# Patient Record
Sex: Female | Born: 1960
Health system: Southern US, Community
[De-identification: ages and names within clinical notes are randomized; demographics above are authoritative.]

## PROBLEM LIST (undated history)

## (undated) DIAGNOSIS — S0300XA Dislocation of jaw, unspecified side, initial encounter: Secondary | ICD-10-CM

## (undated) DIAGNOSIS — M8580 Other specified disorders of bone density and structure, unspecified site: Secondary | ICD-10-CM

## (undated) DIAGNOSIS — N96 Recurrent pregnancy loss: Secondary | ICD-10-CM

## (undated) HISTORY — PX: DILATION AND CURETTAGE OF UTERUS: SHX78

## (undated) HISTORY — PX: TONSILLECTOMY: SUR1361

## (undated) HISTORY — DX: Recurrent pregnancy loss: N96

## (undated) HISTORY — DX: Dislocation of jaw, unspecified side, initial encounter: S03.00XA

## (undated) HISTORY — DX: Other specified disorders of bone density and structure, unspecified site: M85.80

## (undated) HISTORY — PX: TMJ ARTHROPLASTY: SHX1066

---

## 2001-06-05 HISTORY — PX: TUBAL LIGATION: SHX77

## 2004-05-20 ENCOUNTER — Ambulatory Visit: Payer: Self-pay | Admitting: Family Medicine

## 2005-06-05 HISTORY — PX: VAGINAL HYSTERECTOMY: SUR661

## 2006-02-15 ENCOUNTER — Ambulatory Visit: Payer: Self-pay

## 2006-04-24 ENCOUNTER — Inpatient Hospital Stay: Payer: Self-pay

## 2007-08-14 ENCOUNTER — Ambulatory Visit: Payer: Self-pay | Admitting: Family Medicine

## 2013-10-06 ENCOUNTER — Emergency Department: Payer: Self-pay | Admitting: Emergency Medicine

## 2013-10-06 LAB — COMPREHENSIVE METABOLIC PANEL
AST: 14 U/L — AB (ref 15–37)
Albumin: 4.2 g/dL (ref 3.4–5.0)
Alkaline Phosphatase: 56 U/L
Anion Gap: 7 (ref 7–16)
BILIRUBIN TOTAL: 0.4 mg/dL (ref 0.2–1.0)
BUN: 13 mg/dL (ref 7–18)
CHLORIDE: 106 mmol/L (ref 98–107)
CO2: 26 mmol/L (ref 21–32)
CREATININE: 0.62 mg/dL (ref 0.60–1.30)
Calcium, Total: 9.5 mg/dL (ref 8.5–10.1)
Glucose: 88 mg/dL (ref 65–99)
Osmolality: 277 (ref 275–301)
POTASSIUM: 3.5 mmol/L (ref 3.5–5.1)
SGPT (ALT): 21 U/L (ref 12–78)
Sodium: 139 mmol/L (ref 136–145)
Total Protein: 7.8 g/dL (ref 6.4–8.2)

## 2013-10-06 LAB — CBC WITH DIFFERENTIAL/PLATELET
Basophil #: 0 10*3/uL (ref 0.0–0.1)
Basophil %: 0.6 %
EOS ABS: 0.1 10*3/uL (ref 0.0–0.7)
EOS PCT: 1.5 %
HCT: 39 % (ref 35.0–47.0)
HGB: 12.8 g/dL (ref 12.0–16.0)
LYMPHS ABS: 1.9 10*3/uL (ref 1.0–3.6)
Lymphocyte %: 26.7 %
MCH: 31.2 pg (ref 26.0–34.0)
MCHC: 32.9 g/dL (ref 32.0–36.0)
MCV: 95 fL (ref 80–100)
MONOS PCT: 7.7 %
Monocyte #: 0.6 x10 3/mm (ref 0.2–0.9)
Neutrophil #: 4.5 10*3/uL (ref 1.4–6.5)
Neutrophil %: 63.5 %
PLATELETS: 209 10*3/uL (ref 150–440)
RBC: 4.1 10*6/uL (ref 3.80–5.20)
RDW: 12.6 % (ref 11.5–14.5)
WBC: 7.1 10*3/uL (ref 3.6–11.0)

## 2013-10-06 LAB — URINALYSIS, COMPLETE
BILIRUBIN, UR: NEGATIVE
BLOOD: NEGATIVE
Bacteria: NONE SEEN
Glucose,UR: NEGATIVE mg/dL (ref 0–75)
Ketone: NEGATIVE
LEUKOCYTE ESTERASE: NEGATIVE
Nitrite: NEGATIVE
PROTEIN: NEGATIVE
Ph: 6 (ref 4.5–8.0)
Specific Gravity: 1.002 (ref 1.003–1.030)
WBC UR: 1 /HPF (ref 0–5)

## 2013-10-06 LAB — LIPASE, BLOOD: LIPASE: 185 U/L (ref 73–393)

## 2013-10-06 LAB — TROPONIN I

## 2013-10-10 ENCOUNTER — Ambulatory Visit: Payer: Self-pay | Admitting: Family Medicine

## 2013-11-03 HISTORY — PX: COLONOSCOPY WITH ESOPHAGOGASTRODUODENOSCOPY (EGD): SHX5779

## 2013-11-05 ENCOUNTER — Ambulatory Visit: Payer: Self-pay | Admitting: Gastroenterology

## 2014-07-08 IMAGING — US ABDOMEN ULTRASOUND LIMITED
1 series · 14 of 25 positions shown · non-contrast
Comparison: CT abdomen pelvis -10/10/2013

CLINICAL DATA: Epigastric abdominal pain and nausea

EXAM:
US ABDOMEN LIMITED - RIGHT UPPER QUADRANT

[Series 1: abdomen ultrasound limited · 0.31mm/px · 14 of 44 slices shown]
[im 1/44]
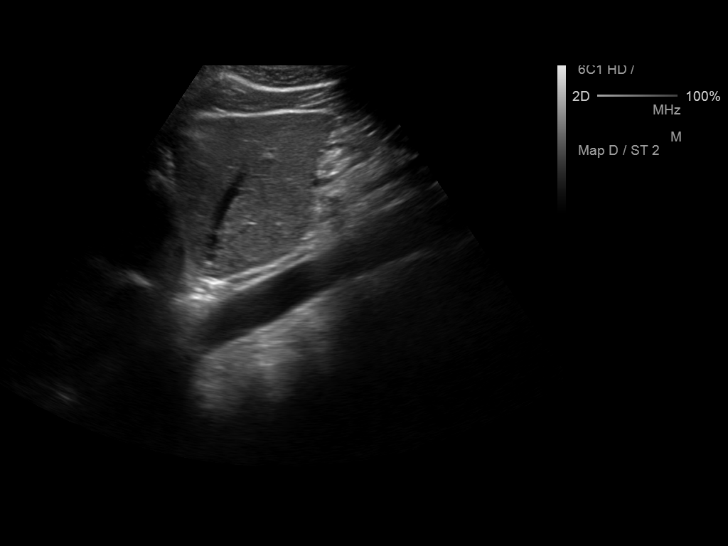
[im 4/44]
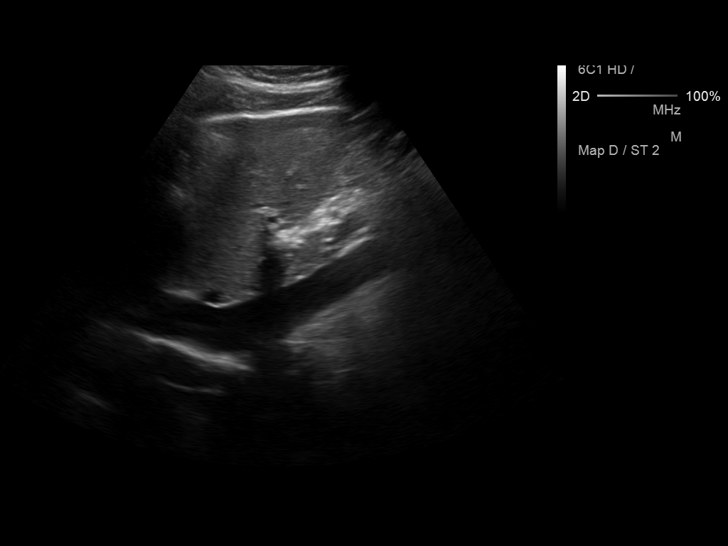
[im 8/44]
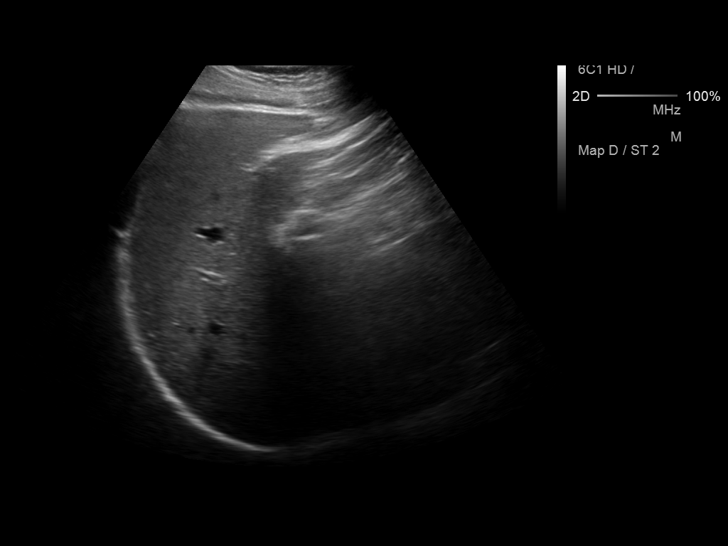
[im 11/44]
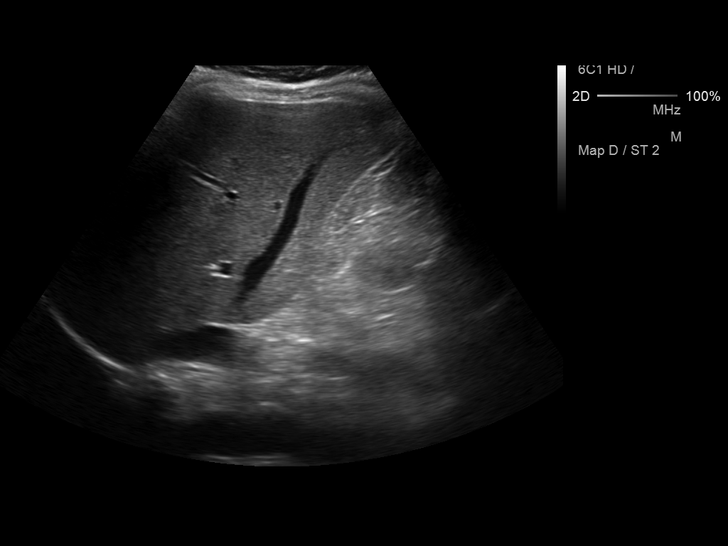
[im 15/44]
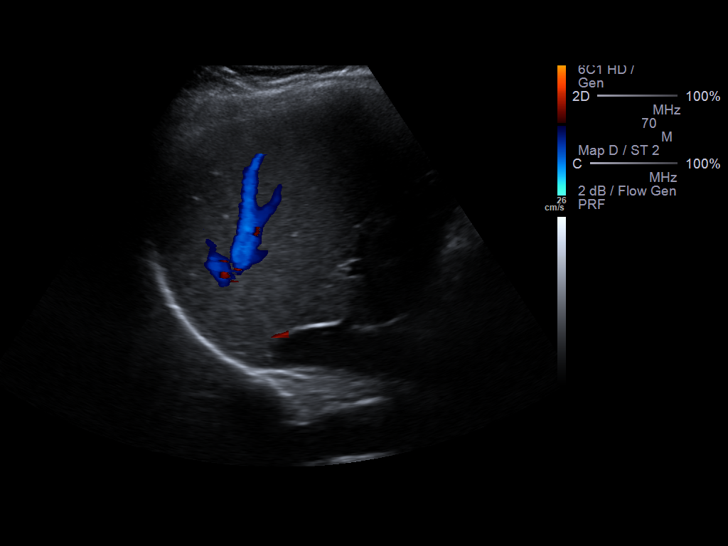
[im 17/44]
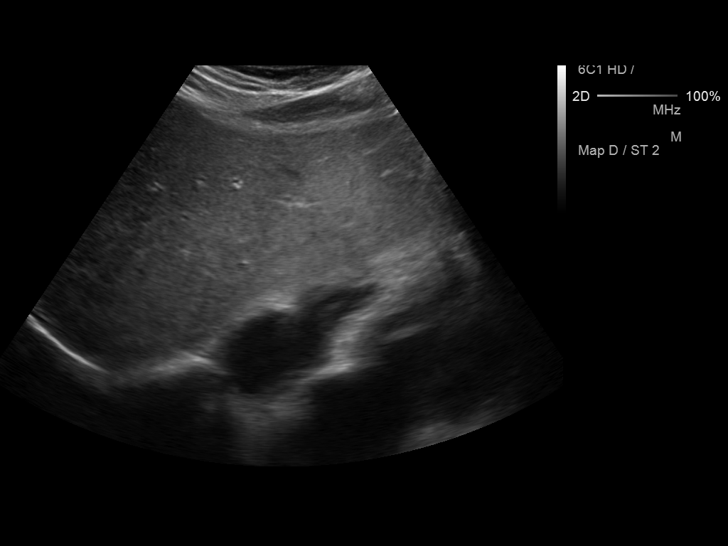
[im 20/44]
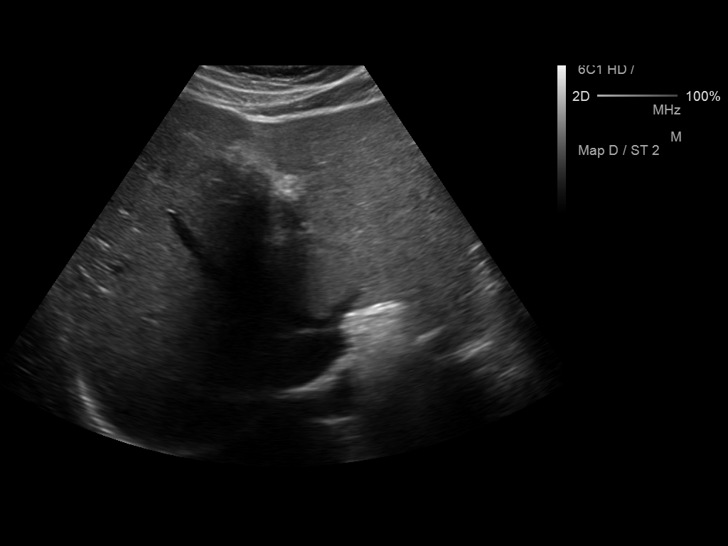
[im 24/44]
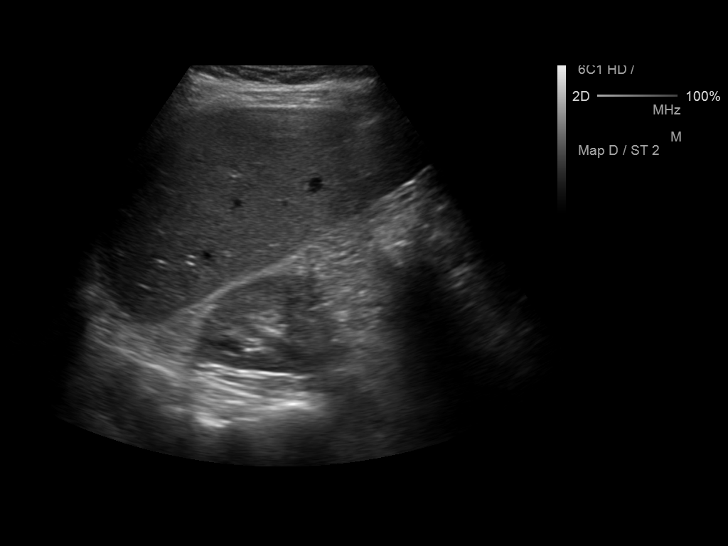
[im 27/44]
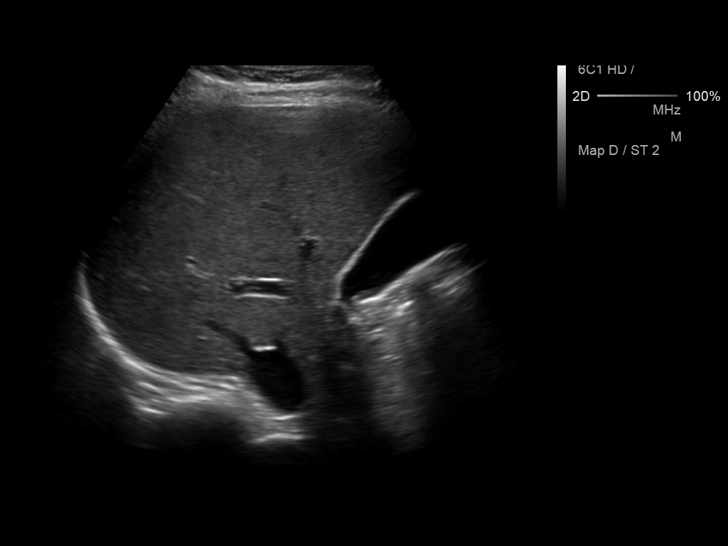
[im 29/44]
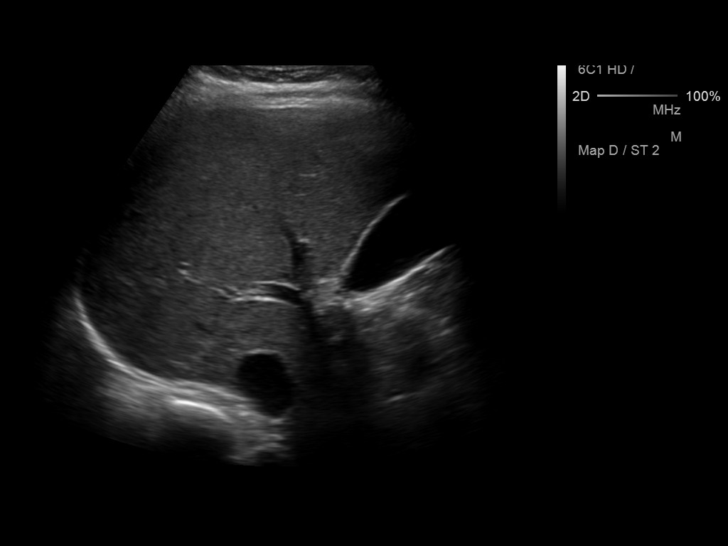
[im 33/44]
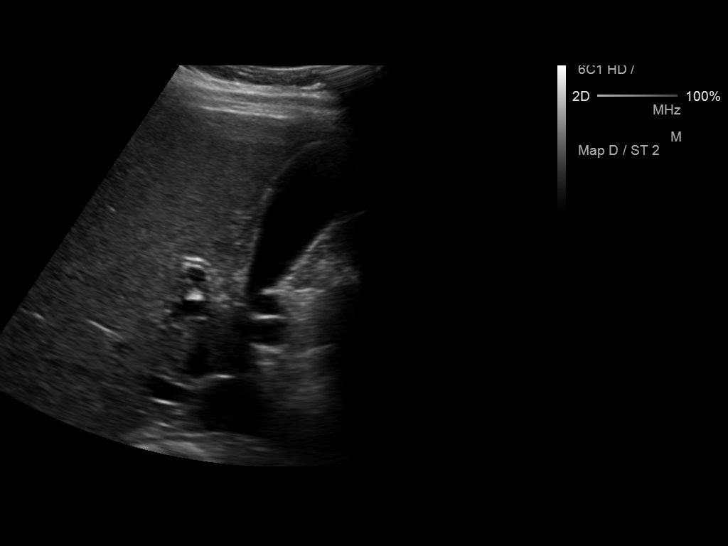
[im 36/44]
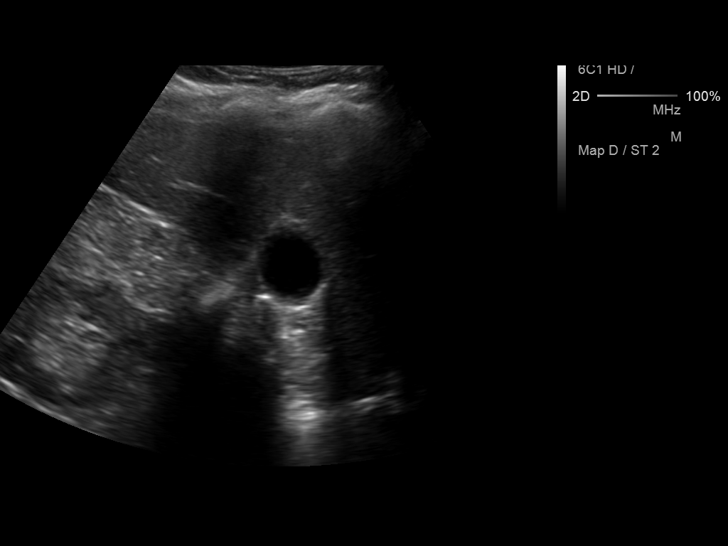
[im 40/44]
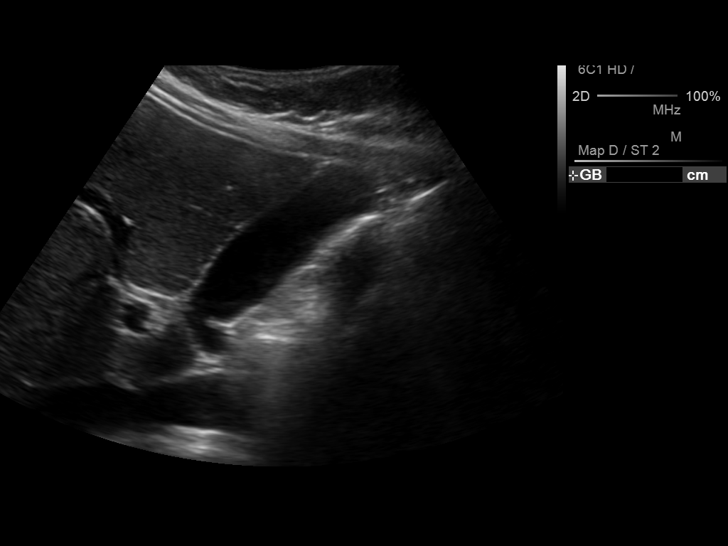
[im 44/44]
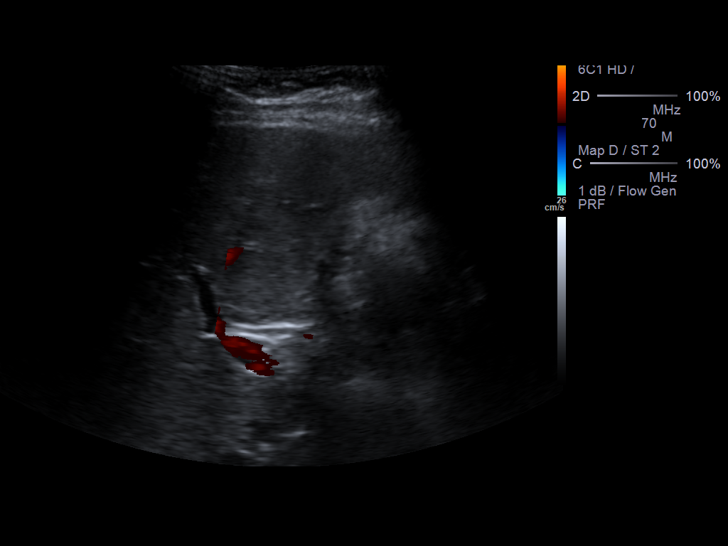

[14 of 25 positions shown; findings below may reference images not displayed]

FINDINGS: Gallbladder:

Sonographically normal. No echogenic gallstones or gall sludge. No
gallbladder wall thickening or pericholecystic fluid. Negative
sonographic Murphy's sign.

Common bile duct:

Diameter: Normal in size measuring 2.1 mm in diameter

Liver:

There is homogeneous echotexture of the hepatic parenchyma. There is
an approximately 1.3 x 0.9 x 1.0 cm anechoic cyst within the
subcapsular aspect of the right lobe of the liver which contains a
thin internal septation and compatible with the previously
characterized hepatic cyst demonstrated on preceding abdominal CT.
There is a minimal amount of focal fatty infiltration adjacent to
the fissure for ligamentum teres (image 23). No intrahepatic biliary
duct dilatation. No ascites.
IMPRESSION: 1. No explanation for patient's epigastric abdominal pain and
nausea. Specifically, no evidence of cholelithiasis.
2. Grossly unchanged minimally complex approximately 1.3 cm hepatic
cyst.

## 2016-01-19 ENCOUNTER — Encounter: Payer: Self-pay | Admitting: *Deleted

## 2016-01-19 ENCOUNTER — Emergency Department
Admission: EM | Admit: 2016-01-19 | Discharge: 2016-01-19 | Disposition: A | Discharge status: Home / Self Care | Payer: Managed Care, Other (non HMO) | Attending: Emergency Medicine | Admitting: Emergency Medicine

## 2016-01-19 DIAGNOSIS — K2970 Gastritis, unspecified, without bleeding: Secondary | ICD-10-CM | POA: Diagnosis not present

## 2016-01-19 DIAGNOSIS — R1013 Epigastric pain: Secondary | ICD-10-CM | POA: Diagnosis present

## 2016-01-19 LAB — CBC
HCT: 40.5 % (ref 35.0–47.0)
HEMOGLOBIN: 13.9 g/dL (ref 12.0–16.0)
MCH: 32 pg (ref 26.0–34.0)
MCHC: 34.3 g/dL (ref 32.0–36.0)
MCV: 93 fL (ref 80.0–100.0)
Platelets: 218 10*3/uL (ref 150–440)
RBC: 4.36 MIL/uL (ref 3.80–5.20)
RDW: 12.1 % (ref 11.5–14.5)
WBC: 7.8 10*3/uL (ref 3.6–11.0)

## 2016-01-19 LAB — URINALYSIS COMPLETE WITH MICROSCOPIC (ARMC ONLY)
Bacteria, UA: NONE SEEN
Bilirubin Urine: NEGATIVE
Glucose, UA: NEGATIVE mg/dL
KETONES UR: NEGATIVE mg/dL
Leukocytes, UA: NEGATIVE
Nitrite: NEGATIVE
PH: 6 (ref 5.0–8.0)
PROTEIN: NEGATIVE mg/dL
SPECIFIC GRAVITY, URINE: 1.006 (ref 1.005–1.030)
WBC UA: NONE SEEN WBC/hpf (ref 0–5)

## 2016-01-19 LAB — COMPREHENSIVE METABOLIC PANEL
ALBUMIN: 4.7 g/dL (ref 3.5–5.0)
ALT: 19 U/L (ref 14–54)
ANION GAP: 10 (ref 5–15)
AST: 19 U/L (ref 15–41)
Alkaline Phosphatase: 57 U/L (ref 38–126)
BUN: 13 mg/dL (ref 6–20)
CHLORIDE: 106 mmol/L (ref 101–111)
CO2: 22 mmol/L (ref 22–32)
CREATININE: 0.52 mg/dL (ref 0.44–1.00)
Calcium: 9.6 mg/dL (ref 8.9–10.3)
GFR calc non Af Amer: >60 mL/min (ref >60)
GLUCOSE: 89 mg/dL (ref 65–99)
Potassium: 3.7 mmol/L (ref 3.5–5.1)
SODIUM: 138 mmol/L (ref 135–145)
Total Bilirubin: 0.7 mg/dL (ref 0.3–1.2)
Total Protein: 8.1 g/dL (ref 6.5–8.1)

## 2016-01-19 LAB — LIPASE, BLOOD: LIPASE: 23 U/L (ref 11–51)

## 2016-01-19 MED ORDER — GI COCKTAIL ~~LOC~~
30.0000 mL | ORAL | Status: AC
  Administered 2016-01-19: 30 mL via ORAL
  Filled 2016-01-19: qty 30

## 2016-01-19 MED ORDER — ONDANSETRON 8 MG PO TBDP
8.0000 mg | ORAL_TABLET | Freq: Once | ORAL | Status: AC
  Administered 2016-01-19: 8 mg via ORAL
  Filled 2016-01-19: qty 1

## 2016-01-19 MED ORDER — METOCLOPRAMIDE HCL 10 MG PO TABS
10.0000 mg | ORAL_TABLET | Freq: Once | ORAL | Status: AC
  Administered 2016-01-19: 10 mg via ORAL
  Filled 2016-01-19: qty 1

## 2016-01-19 MED ORDER — METOCLOPRAMIDE HCL 10 MG PO TABS: 10.0000 mg | ORAL_TABLET | Freq: Four times a day (QID) | ORAL | 0 refills | Status: DC | PRN

## 2016-01-19 MED ORDER — FAMOTIDINE 20 MG PO TABS
40.0000 mg | ORAL_TABLET | Freq: Once | ORAL | Status: AC
  Administered 2016-01-19: 40 mg via ORAL
  Filled 2016-01-19: qty 2

## 2016-01-19 MED ORDER — SUCRALFATE 1 G PO TABS: 1.0000 g | ORAL_TABLET | Freq: Four times a day (QID) | ORAL | 1 refills | Status: DC

## 2016-01-19 MED ORDER — FAMOTIDINE 20 MG PO TABS: 20.0000 mg | ORAL_TABLET | Freq: Two times a day (BID) | ORAL | 0 refills | Status: DC

## 2016-01-19 NOTE — ED Triage Notes (Addendum)
Pt arrived to ED reporting sharp centralized abd pain for the past week. Pt reports having similar pains 2 years ago and no dx was given. Pt reports having had nausea but denies vomiting. Pt reports food does not make the pain worse and nothing this week has decreased pts pain. Pt denies decrease in appetite. No changes in urine or BMs. Pt denies having fevers.

## 2016-01-19 NOTE — ED Provider Notes (Signed)
[facility] Emergency Department Provider Note  ____________________________________________  Time seen: Approximately 5:10 PM  I have reviewed the triage vital signs and the nursing notes.   HISTORY  Chief Complaint Abdominal Pain    HPI Kari Gray is a 55 y.o. female who complains of upper abdominal pain with nausea but no vomiting or diarrhea this and going on intermittently for the past week. She had similar symptoms about 2 years ago and underwent an extensive workup with ultrasound and HIDA scan and CT scan and upper endoscopy without any findings. She tried taking 1 Prevacid this morning which did not relieve the symptoms. It is moderate intensity, waxing and waning, no aggravating or alleviating factors. Unable to establish any pattern to the pain whatsoever. Feels like a ringing twisting sensation in her upper abdomen. Radiates to the back. No radiation to the chest. No chest pain shortness of breath fevers chills. No dizziness or syncope.   History reviewed. No pertinent past medical history.   There are no active problems to display for this patient.    Past Surgical History:  Procedure Laterality Date  . ABDOMINAL HYSTERECTOMY     partial  . TMJ ARTHROPLASTY    . TONSILLECTOMY       Prior to Admission medications   Medication Sig Start Date End Date Taking? Authorizing Provider  famotidine (PEPCID) 20 MG tablet Take 1 tablet (20 mg total) by mouth 2 (two) times daily. 01/19/16   Sharman CheekPhillip Graviel Payeur, MD  metoCLOPramide (REGLAN) 10 MG tablet Take 1 tablet (10 mg total) by mouth every 6 (six) hours as needed. 01/19/16   Sharman CheekPhillip Levie Owensby, MD  sucralfate (CARAFATE) 1 g tablet Take 1 tablet (1 g total) by mouth 4 (four) times daily. 01/19/16   Sharman CheekPhillip Majesty Oehlert, MD     Allergies Codeine   History reviewed. No pertinent family history.  Social History Social History  Substance Use Topics  . Smoking status: Never Smoker  . Smokeless  tobacco: Never Used  . Alcohol use Yes     Comment: socially    Review of Systems  Constitutional:   No fever or chills.  ENT:   No sore throat. No rhinorrhea. Cardiovascular:   No chest pain. Respiratory:   No dyspnea or cough. Gastrointestinal:   Positive upper abdominal pain without vomiting or diarrhea.  Genitourinary:   Negative for dysuria or difficulty urinating. Musculoskeletal:   Negative for focal pain or swelling Neurological:   Negative for headaches 10-point ROS otherwise negative.  ____________________________________________   PHYSICAL EXAM:  VITAL SIGNS: ED Triage Vitals  Enc Vitals Group     BP 01/19/16 1453 (!) 149/85     Pulse Rate 01/19/16 1453 65     Resp 01/19/16 1453 16     Temp 01/19/16 1453 98.6 F (37 C)     Temp Source 01/19/16 1453 Oral     SpO2 01/19/16 1453 100 %     Weight 01/19/16 1450 135 lb (61.2 kg)     Height 01/19/16 1450 5\' 4"  (1.626 m)     Head Circumference --      Peak Flow --      Pain Score 01/19/16 1450 9     Pain Loc --      Pain Edu? --      Excl. in GC? --     Vital signs reviewed, nursing assessments reviewed.   Constitutional:   Alert and oriented. Well appearing and in no distress. Eyes:   No scleral  icterus. No conjunctival pallor. PERRL. EOMI.  No nystagmus. ENT   Head:   Normocephalic and atraumatic.   Nose:   No congestion/rhinnorhea. No septal hematoma   Mouth/Throat:   MMM, mild pharyngeal erythema. No peritonsillar mass.    Neck:   No stridor. No SubQ emphysema. No meningismus. Hematological/Lymphatic/Immunilogical:   No cervical lymphadenopathy. Cardiovascular:   RRR. Symmetric bilateral radial and DP pulses.  No murmurs.  Respiratory:   Normal respiratory effort without tachypnea nor retractions. Breath sounds are clear and equal bilaterally. No wheezes/rales/rhonchi. Gastrointestinal:   Soft with left upper quadrant tenderness. No splenomegaly.. Non distended. There is no CVA tenderness.  No  rebound, rigidity, or guarding. Genitourinary:   deferred Musculoskeletal:   Nontender with normal range of motion in all extremities. No joint effusions.  No lower extremity tenderness.  No edema. Neurologic:   Normal speech and language.  CN 2-10 normal. Motor grossly intact. No gross focal neurologic deficits are appreciated.  Skin:    Skin is warm, dry and intact. No rash noted.  No petechiae, purpura, or bullae.  ____________________________________________    LABS (pertinent positives/negatives) (all labs ordered are listed, but only abnormal results are displayed) Labs Reviewed  URINALYSIS COMPLETEWITH MICROSCOPIC ([facility] ONLY) - Abnormal; Notable for the following:       Result Value   Color, Urine STRAW (*)    APPearance CLEAR (*)    Hgb urine dipstick 1+ (*)    Squamous Epithelial / LPF 0-5 (*)    All other components within normal limits  LIPASE, BLOOD  COMPREHENSIVE METABOLIC PANEL  CBC   ____________________________________________   EKG    ____________________________________________    RADIOLOGY    ____________________________________________   PROCEDURES Procedures  ____________________________________________   INITIAL IMPRESSION / ASSESSMENT AND PLAN / ED COURSE  Pertinent labs & imaging results that were available during my care of the patient were reviewed by me and considered in my medical decision making (see chart for details).  Patient well appearing no acute distress. Afebrile, normal vital signs. Unremarkable labs. Overall exam is benign and reassuring, consistent with gastritis and acid reflux. We'll give a GI cocktail famotidine and Zofran and Reglan and reassess. Follow up with primary care.    ----------------------------------------- 6:50 PM on 01/19/2016 -----------------------------------------  Remains stable. Symptoms not significantly improved with GI cocktail, but otherwise her history and physical are  reassuring.Considering the patient's symptoms, medical history, and physical examination today, I have low suspicion for ACS, PE, TAD, pneumothorax, carditis, mediastinitis, pneumonia, CHF, or sepsis. I have low suspicion for cholecystitis or biliary pathology, pancreatitis, perforation or bowel obstruction, hernia, intra-abdominal abscess, AAA or dissection, volvulus or intussusception, mesenteric ischemia, or appendicitis.     Clinical Course   ____________________________________________   FINAL CLINICAL IMPRESSION(S) / ED DIAGNOSES  Final diagnoses:  Gastritis  Epigastric pain       Portions of this note were generated with dragon dictation software. Dictation errors may occur despite best attempts at proofreading.    Sharman CheekPhillip Tyrian Peart, MD 01/19/16 [phone]

## 2017-02-09 ENCOUNTER — Ambulatory Visit (INDEPENDENT_AMBULATORY_CARE_PROVIDER_SITE_OTHER): Payer: 59 | Admitting: Certified Nurse Midwife

## 2017-02-09 ENCOUNTER — Encounter: Payer: Self-pay | Admitting: Certified Nurse Midwife

## 2017-02-09 VITALS — BP 120/70 | HR 70 | Ht 64.5 in | Wt 149.0 lb

## 2017-02-09 DIAGNOSIS — Z01419 Encounter for gynecological examination (general) (routine) without abnormal findings: Secondary | ICD-10-CM | POA: Diagnosis not present

## 2017-02-09 DIAGNOSIS — N941 Unspecified dyspareunia: Secondary | ICD-10-CM | POA: Diagnosis not present

## 2017-02-09 DIAGNOSIS — N952 Postmenopausal atrophic vaginitis: Secondary | ICD-10-CM

## 2017-02-09 NOTE — Progress Notes (Signed)
Gynecology Annual Exam  PCP: Patient, No Pcp Per  Chief Complaint:  Chief Complaint  Patient presents with  . Gynecologic Exam    History of Present Illness: Kari Gray is a 56 y.o. G3 P0030 WF who presents for her annual gyn exam. The patient complains of vaginal dryness and dyspareunia. Has tried lubricants and Replens with no relief.. She has a history of having a TVH in 2007 for pelvic pain and menorrhagia. She is postmenopausal. She denies hot flashes and vaginal bleeding. Last pap smear: 03/25/2015, results were NIL/neg. She has not history of abnormal Pap smea  Since her last visit, she has had no significant changes in her health.  Her past medical history is remarkable for TMJ and recurrent SABs.  The patient does perform self breast exams. Her last mammogram was 03/25/2015, results were negative.   There is a family history of breast cancer in her MGM. Genetic testing has not been done.  There is no family history of ovarian cancer.   Last colonoscopy: 11/2013. Results: normal. Next one due in 10 years.  Dexa scan: 04/10/2005. Results: osteopenia  The patient denies smoking.  She has an occasional glass of wine.  She denies illegal drug use.  The patient reports exercising occasionally. She goes to a boxing class  The patient denies current symptoms of depression.    Review of Systems: Review of Systems  Constitutional: Negative for chills, fever and weight loss.  HENT: Negative for congestion, sinus pain and sore throat.   Eyes: Negative for blurred vision and pain.  Respiratory: Negative for hemoptysis, shortness of breath and wheezing.   Cardiovascular: Negative for chest pain, palpitations and leg swelling.  Gastrointestinal: Negative for abdominal pain, blood in stool, diarrhea, heartburn, nausea and vomiting.  Genitourinary: Negative for dysuria, frequency, hematuria and urgency.       Positive for vaginal dryness and dyspareunia    Musculoskeletal: Negative for back pain, joint pain and myalgias.  Skin: Negative for itching and rash.  Neurological: Negative for dizziness, tingling and headaches.  Endo/Heme/Allergies: Negative for environmental allergies and polydipsia. Does not bruise/bleed easily.       Negative for hirsutism   Psychiatric/Behavioral: Negative for depression. The patient is not nervous/anxious and does not have insomnia.     Past Medical History:  Past Medical History:  Diagnosis Date  . History of recurrent miscarriages   . Osteopenia   . TMJ (dislocation of temporomandibular joint)     Past Surgical History:  Past Surgical History:  Procedure Laterality Date  . COLONOSCOPY WITH ESOPHAGOGASTRODUODENOSCOPY (EGD)  11/2013  . DILATION AND CURETTAGE OF UTERUS  1999/ 2000/ 2007  . TMJ ARTHROPLASTY    . TONSILLECTOMY    . TUBAL LIGATION  2003   and diagnostic laparoscopy/ Dr Luella Cook  . VAGINAL HYSTERECTOMY  2007   AUB and pelvic pain    Family History:  Family History  Problem Relation Age of Onset  . Rheum arthritis Mother   . Lung cancer Mother 36  . Dementia Mother   . Stroke Father   . Breast cancer Maternal Grandmother 50  . Diabetes Cousin     Social History:  Social History   Social History  . Marital status: Married    Spouse name: N/A  . Number of children: 0  . Years of education: N/A   Occupational History  . Business office    Social History Main Topics  . Smoking status: Never Smoker  . Smokeless  tobacco: Never Used  . Alcohol use Yes     Comment: socially  . Drug use: No  . Sexual activity: Yes    Partners: Male    Birth control/ protection: Post-menopausal   Other Topics Concern  . Not on file   Social History Narrative  . No narrative on file    Allergies:  Allergies  Allergen Reactions  . Codeine Other (See Comments)    Severe headaches.     Medications:  Current Outpatient Prescriptions:  .  aspirin EC 81 MG tablet, Take by mouth.,  Disp: , Rfl:  .  Multiple Vitamin (MULTIVITAMIN) capsule, Take by mouth., Disp: , Rfl:  Physical Exam Vitals: BP 120/70   Pulse 70   Ht 5' 4.5" (1.638 m)   Wt 149 lb (67.6 kg)   BMI 25.18 kg/m   General: WF in NAD HEENT: normocephalic, anicteric Neck: no thyroid enlargement, no palpable nodules, no cervical lymphadenopathy  Pulmonary: No increased work of breathing, CTAB Cardiovascular: RRR, without murmur  Breast: Breast symmetrical, no tenderness, no palpable nodules or masses, no skin or nipple retraction present, no nipple discharge.  No axillary, infraclavicular or supraclavicular lymphadenopathy. Abdomen: Soft, non-tender, non-distended.  Umbilicus without lesions.  No hepatomegaly or masses palpable. No evidence of hernia. Genitourinary:  External: Normal external female genitalia.  Normal urethral meatus, normal Bartholin'Gray and Skene'Gray glands.    Vagina: pale flattened rugae, no evidence of prolapse.    Cervix: surgically absent  Uterus: surgically absent  Adnexa: No adnexal masses, non-tender  Rectal: deferred  Lymphatic: no evidence of inguinal lymphadenopathy Extremities: no edema, erythema, or tenderness Neurologic: Grossly intact Psychiatric: mood appropriate, affect full     Assessment: 56 y.o. postmenopausal annual gyn exam Dyspareunia due to vaginal dryness   1) Breast cancer screening - recommend monthly self breast exam and annual screening mammograms.  [facility]  2) Discussed treatments for vaginal dryness due to menopause: lubricants, lubricants, topical estrogen, Intrarosa, Osphena, and oral estrogen. Discussed pros and cons and risks of each of these. Mutual decision made to try Estrace vaginal cream: 1Gram 3x/week. Can decrease to 2x/Week depending on response.   3) Pap not done. ASCCP guidelines and rational discussed.  Patient opts for every 3 years screening interval  4) Colon cancer screening: colonoscopy UTD  5) Routine  healthcare maintenance including cholesterol and diabetes screening not discussed.   6) Osteoporosis prevention: Discussed calcium and vitamin D3 requirements as well as the role of exercise in osteoporosis prevention. Consider Dexa scan next year.  Kari Connersolleen Willet Schleifer, CNM

## 2017-02-10 ENCOUNTER — Encounter: Payer: Self-pay | Admitting: Certified Nurse Midwife

## 2017-02-10 DIAGNOSIS — M8580 Other specified disorders of bone density and structure, unspecified site: Secondary | ICD-10-CM | POA: Insufficient documentation

## 2017-02-10 DIAGNOSIS — N952 Postmenopausal atrophic vaginitis: Secondary | ICD-10-CM | POA: Insufficient documentation

## 2017-02-10 MED ORDER — ESTRADIOL 0.1 MG/GM VA CREA: 1.0000 | TOPICAL_CREAM | VAGINAL | 2 refills | Status: DC

## 2017-09-07 ENCOUNTER — Encounter: Payer: Self-pay | Admitting: Podiatry

## 2017-09-07 ENCOUNTER — Ambulatory Visit: Payer: 59 | Admitting: Podiatry

## 2017-09-07 ENCOUNTER — Ambulatory Visit (INDEPENDENT_AMBULATORY_CARE_PROVIDER_SITE_OTHER): Payer: 59

## 2017-09-07 DIAGNOSIS — M779 Enthesopathy, unspecified: Secondary | ICD-10-CM | POA: Diagnosis not present

## 2017-09-07 DIAGNOSIS — M2010 Hallux valgus (acquired), unspecified foot: Secondary | ICD-10-CM

## 2017-09-07 DIAGNOSIS — M778 Other enthesopathies, not elsewhere classified: Secondary | ICD-10-CM

## 2017-09-07 MED ORDER — MELOXICAM 15 MG PO TABS: 15.0000 mg | ORAL_TABLET | Freq: Every day | ORAL | 1 refills | Status: AC

## 2017-09-10 NOTE — Progress Notes (Signed)
   Subjective: 57 year old female presenting today as a new patient with a chief complaint of bunions bilaterally, left worse than right, that have been present for over a year. She reports associated pain to the dorsum of the left foot. She has not done anything to treat the symptoms. Walking increases the pain to the dorsum of the foot. Patient is here for further evaluation and treatment.    Past Medical History:  Diagnosis Date  . History of recurrent miscarriages   . Osteopenia   . TMJ (dislocation of temporomandibular joint)       Objective: Physical Exam General: The patient is alert and oriented x3 in no acute distress.  Dermatology: Skin is cool, dry and supple bilateral lower extremities. Negative for open lesions or macerations.  Vascular: Palpable pedal pulses bilaterally. No edema or erythema noted. Capillary refill within normal limits.  Neurological: Epicritic and protective threshold grossly intact bilaterally.   Musculoskeletal Exam: Clinical evidence of bunion deformity noted to the respective foot. Negative for pain on palpation or with range of motion of the first MPJ. Lateral deviation of the hallux noted consistent with hallux abductovalgus.  Radiographic Exam: Increased intermetatarsal angle greater than 15 with a hallux abductus angle greater than 30 noted on AP view. Moderate degenerative changes noted within the first MPJ.  Assessment: 1. HAV w/ bunion deformity bilateral - asymptomatic  2. Midfoot DJD/capsulitis left    Plan of Care:  1. Patient was evaluated. X-Rays reviewed. 2. Today we discussed the conservative versus surgical management of bunion deformity. 3. Recommended conservative treatment for patient. Surgery would likely not improve patient's midfoot symptoms.  4. Recommended Vionic sandals and OTC insoles.  5. Prescription for Meloxicam provided to patient.  6. Return to clinic as needed.    Felecia ShellingBrent M. Latash Nouri, DPM [facility]  Dr. Felecia ShellingBrent M. Nadira Single, DPM    [address]                                        [place], [place] [address]                Office [phone]  Fax [phone]

## 2018-05-17 ENCOUNTER — Encounter: Payer: Self-pay | Admitting: Certified Nurse Midwife

## 2018-05-17 ENCOUNTER — Ambulatory Visit (INDEPENDENT_AMBULATORY_CARE_PROVIDER_SITE_OTHER): Payer: 59 | Admitting: Certified Nurse Midwife

## 2018-05-17 ENCOUNTER — Other Ambulatory Visit (HOSPITAL_COMMUNITY)
Admission: RE | Admit: 2018-05-17 | Discharge: 2018-05-17 | Disposition: A | Discharge status: Home / Self Care | Payer: 59 | Source: Ambulatory Visit | Attending: Certified Nurse Midwife | Admitting: Certified Nurse Midwife

## 2018-05-17 VITALS — BP 110/70 | HR 64 | Ht 64.0 in | Wt 150.0 lb

## 2018-05-17 DIAGNOSIS — Z1159 Encounter for screening for other viral diseases: Secondary | ICD-10-CM

## 2018-05-17 DIAGNOSIS — Z01419 Encounter for gynecological examination (general) (routine) without abnormal findings: Secondary | ICD-10-CM | POA: Diagnosis not present

## 2018-05-17 DIAGNOSIS — Z131 Encounter for screening for diabetes mellitus: Secondary | ICD-10-CM

## 2018-05-17 DIAGNOSIS — Z23 Encounter for immunization: Secondary | ICD-10-CM | POA: Diagnosis not present

## 2018-05-17 DIAGNOSIS — Z1272 Encounter for screening for malignant neoplasm of vagina: Secondary | ICD-10-CM | POA: Diagnosis present

## 2018-05-17 DIAGNOSIS — Z13220 Encounter for screening for lipoid disorders: Secondary | ICD-10-CM

## 2018-05-17 MED ORDER — ESTRADIOL 0.1 MG/GM VA CREA: 1.0000 | TOPICAL_CREAM | VAGINAL | 2 refills | Status: DC

## 2018-05-17 NOTE — Progress Notes (Signed)
Gynecology Annual Exam  PCP: Patient, No Pcp Per  Chief Complaint:  Chief Complaint  Patient presents with  . Gynecologic Exam    History of Present Illness: Kari Gray is a 57 y.o. G3 P0030 WF who presents for her annual gyn exam. The patient has no gyn complaints. Started using Estrace cream vaginally for vaginal dryness and dyspareunia with relief of symptoms. Currently uses the topical estrogen twice a week. She has a history of having a TVH in 2007 for pelvic pain and menorrhagia. She is postmenopausal. She denies significant  hot flashes or vaginal bleeding. Last pap smear: 03/25/2015, results were NIL/neg. She has not history of abnormal Pap smears.  Since her last visit 02/09/2017, she has had no other significant changes in her health. She has a little nasal congestion currently. She did receive her flu vaccine this year.   Her past medical history is remarkable for TMJ and recurrent SABs.  The patient does perform self breast exams. Her last mammogram was 02/27/2017, results were negative.   There is a family history of breast cancer in her MGM. Genetic testing has not been done.  There is no family history of ovarian cancer.   Last colonoscopy: 11/2013. Results: normal. Next one due in 10 years.  Dexa scan: 04/10/2005. Results: osteopenia  The patient denies smoking.  She has 2-3 glasses of wine a week.  She denies illegal drug use.  The patient reports exercising occasionally.   The patient denies current symptoms of depression.    Review of Systems: Review of Systems  Constitutional: Negative for chills, fever and weight loss.  HENT: Negative for congestion, sinus pain and sore throat.   Eyes: Negative for blurred vision and pain.  Respiratory: Negative for hemoptysis, shortness of breath and wheezing.   Cardiovascular: Negative for chest pain, palpitations and leg swelling.  Gastrointestinal: Negative for abdominal pain, blood in stool, diarrhea,  heartburn, nausea and vomiting.  Genitourinary: Negative for dysuria, frequency, hematuria and urgency.       Positive for difficulty holding urine.  Musculoskeletal: Negative for back pain, joint pain and myalgias.  Skin: Negative for itching and rash.  Neurological: Negative for dizziness, tingling and headaches.  Endo/Heme/Allergies: Negative for environmental allergies and polydipsia. Does not bruise/bleed easily.       Negative for hirsutism   Psychiatric/Behavioral: Negative for depression. The patient is not nervous/anxious and does not have insomnia.     Past Medical History:  Past Medical History:  Diagnosis Date  . History of recurrent miscarriages   . Osteopenia   . TMJ (dislocation of temporomandibular joint)     Past Surgical History:  Past Surgical History:  Procedure Laterality Date  . COLONOSCOPY WITH ESOPHAGOGASTRODUODENOSCOPY (EGD)  11/2013  . DILATION AND CURETTAGE OF UTERUS  1999/ 2000/ 2007  . TMJ ARTHROPLASTY    . TONSILLECTOMY    . TUBAL LIGATION  2003   and diagnostic laparoscopy/ Dr Luella Cookosenow  . VAGINAL HYSTERECTOMY  2007   AUB and pelvic pain    Family History:  Family History  Problem Relation Age of Onset  . Rheum arthritis Mother   . Lung cancer Mother [id number]  . Dementia Mother   . Stroke Father   . Breast cancer Maternal Grandmother [id number]  . Diabetes Cousin     Social History:  Social History   Socioeconomic History  . Marital status: Married    Spouse name: Not on file  . Number of children: 0  .  Years of education: Not on file  . Highest education level: Not on file  Occupational History  . Occupation: Geologist, engineering  Social Needs  . Financial resource strain: Not on file  . Food insecurity:    Worry: Not on file    Inability: Not on file  . Transportation needs:    Medical: Not on file    Non-medical: Not on file  Tobacco Use  . Smoking status: Former Smoker    Last attempt to quit: 06/05/1997    Years since quitting: 20.9  .  Smokeless tobacco: Never Used  Substance and Sexual Activity  . Alcohol use: Yes    Comment: socially  . Drug use: No  . Sexual activity: Yes    Partners: Male    Birth control/protection: Post-menopausal  Lifestyle  . Physical activity:    Days per week: Not on file    Minutes per session: Not on file  . Stress: Not on file  Relationships  . Social connections:    Talks on phone: Not on file    Gets together: Not on file    Attends religious service: Not on file    Active member of club or organization: Not on file    Attends meetings of clubs or organizations: Not on file    Relationship status: Not on file  . Intimate partner violence:    Fear of current or ex partner: Not on file    Emotionally abused: Not on file    Physically abused: Not on file    Forced sexual activity: Not on file  Other Topics Concern  . Not on file  Social History Narrative  . Not on file    Allergies:  Allergies  Allergen Reactions  . Codeine Other (See Comments)    Severe headaches.     Medications:  Current Outpatient Medications:  .  aspirin EC 81 MG tablet, Take by mouth., Disp: , Rfl:  .  estradiol (ESTRACE VAGINAL) 0.1 MG/GM vaginal cream, Place 1 Applicatorful vaginally 3 (three) times a week. Can use 2-3 times weekly, Disp: 42.5 g, Rfl: 2 .  Multiple Vitamin (MULTIVITAMIN) capsule, Take by mouth., Disp: , Rfl:  Physical Exam Vitals: BP 110/70   Pulse 64   Ht 5\' 4"  (1.626 m)   Wt 150 lb (68 kg)   BMI 25.75 kg/m   General: WF in NAD HEENT: normocephalic, anicteric Neck: no thyroid enlargement, no palpable nodules, no cervical lymphadenopathy  Pulmonary: No increased work of breathing, CTAB Cardiovascular: RRR, without murmur  Breast: Breast symmetrical, no tenderness, no palpable nodules or masses, no skin or nipple retraction present, no nipple discharge.  No axillary, infraclavicular or supraclavicular lymphadenopathy. Abdomen: Soft, non-tender, non-distended.  Umbilicus  without lesions.  No hepatomegaly or masses palpable. No evidence of hernia. Genitourinary:  External: Normal external female genitalia.  Normal urethral meatus, normal Bartholin's and Skene's glands.    Vagina: pale flattened rugae, no evidence of prolapse.    Cervix: surgically absent  Uterus: surgically absent  Adnexa: No adnexal masses, non-tender  Rectal: deferred  Lymphatic: no evidence of inguinal lymphadenopathy Extremities: no edema, erythema, or tenderness Neurologic: Grossly intact Psychiatric: mood appropriate, affect full     Assessment: 57 y.o. postmenopausal annual gyn exam Dyspareunia / vaginal dryness resolved with topical estrogen   1) Breast cancer screening - recommend monthly self breast exam and annual screening mammograms.  Mammogram scheduled for today.  2) Reviewed treatments for vaginal dryness due to menopause: topical estrogen, Andreas Newport,  Osphena. Refilled Estrace and given samples of Imvexxy.    3) Pap done. ASCCP guidelines and rational discussed.  Patient opts for every 3 years screening interval  4) Colon cancer screening: colonoscopy UTD  5) Routine healthcare maintenance labs including cholesterol and diabetes screening done. TDAP is also needed.  6) Osteoporosis prevention: Discussed calcium and vitamin D3 requirements as well as the role of exercise in osteoporosis prevention. Declines Dexa scan this year.  Farrel Conners, CNM

## 2018-05-18 LAB — HEPATITIS C ANTIBODY

## 2018-05-18 LAB — LIPID PANEL WITH LDL/HDL RATIO
Cholesterol, Total: 213 mg/dL — ABNORMAL HIGH (ref 100–199)
HDL: 64 mg/dL (ref >39)
LDL CALC: 96 mg/dL (ref 0–99)
LDl/HDL Ratio: 1.5 ratio (ref 0.0–3.2)
Triglycerides: 265 mg/dL — ABNORMAL HIGH (ref 0–149)
VLDL CHOLESTEROL CAL: 53 mg/dL — AB (ref 5–40)

## 2018-05-18 LAB — HGB A1C W/O EAG: HEMOGLOBIN A1C: 5.1 % (ref 4.8–5.6)

## 2018-05-21 LAB — CYTOLOGY - PAP: DIAGNOSIS: NEGATIVE

## 2018-06-12 ENCOUNTER — Telehealth: Payer: Self-pay | Admitting: Certified Nurse Midwife

## 2018-06-12 DIAGNOSIS — E781 Pure hyperglyceridemia: Secondary | ICD-10-CM

## 2018-06-12 NOTE — Telephone Encounter (Signed)
Patient called with lab results. Will repeat lipid panel when fasting and will add TSH and BUN/creatinine. Kari Gray, CNM

## 2018-06-25 ENCOUNTER — Other Ambulatory Visit: Payer: 59

## 2018-06-25 DIAGNOSIS — E781 Pure hyperglyceridemia: Secondary | ICD-10-CM

## 2018-06-26 LAB — TSH: TSH: 2.25 u[IU]/mL (ref 0.450–4.500)

## 2018-06-26 LAB — BUN+CREAT
BUN / CREAT RATIO: 25 — AB (ref 9–23)
BUN: 16 mg/dL (ref 6–24)
Creatinine, Ser: 0.63 mg/dL (ref 0.57–1.00)
GFR calc Af Amer: 117 mL/min/{1.73_m2} (ref >59)
GFR calc non Af Amer: 101 mL/min/{1.73_m2} (ref >59)

## 2018-06-26 LAB — LIPID PANEL
CHOLESTEROL TOTAL: 236 mg/dL — AB (ref 100–199)
Chol/HDL Ratio: 3.5 ratio (ref 0.0–4.4)
HDL: 68 mg/dL (ref >39)
LDL CALC: 135 mg/dL — AB (ref 0–99)
TRIGLYCERIDES: 167 mg/dL — AB (ref 0–149)
VLDL CHOLESTEROL CAL: 33 mg/dL (ref 5–40)

## 2018-07-09 ENCOUNTER — Encounter: Payer: Self-pay | Admitting: Certified Nurse Midwife

## 2018-07-11 ENCOUNTER — Other Ambulatory Visit: Payer: Self-pay | Admitting: Certified Nurse Midwife

## 2018-07-11 NOTE — Telephone Encounter (Signed)
90d supply requested

## 2018-07-23 ENCOUNTER — Other Ambulatory Visit: Payer: Self-pay | Admitting: Podiatry

## 2018-12-31 ENCOUNTER — Other Ambulatory Visit: Payer: Self-pay | Admitting: Certified Nurse Midwife

## 2019-04-29 ENCOUNTER — Other Ambulatory Visit: Payer: Self-pay

## 2019-04-29 DIAGNOSIS — Z20822 Contact with and (suspected) exposure to covid-19: Secondary | ICD-10-CM

## 2019-05-01 LAB — NOVEL CORONAVIRUS, NAA: SARS-CoV-2, NAA: NOT DETECTED

## 2019-05-20 ENCOUNTER — Encounter: Payer: Self-pay | Admitting: Certified Nurse Midwife

## 2019-05-20 ENCOUNTER — Other Ambulatory Visit: Payer: Self-pay

## 2019-05-20 ENCOUNTER — Ambulatory Visit (INDEPENDENT_AMBULATORY_CARE_PROVIDER_SITE_OTHER): Payer: BC Managed Care – PPO | Admitting: Certified Nurse Midwife

## 2019-05-20 VITALS — BP 114/70 | HR 63 | Temp 97.1°F | Ht 65.5 in | Wt 155.0 lb

## 2019-05-20 DIAGNOSIS — N952 Postmenopausal atrophic vaginitis: Secondary | ICD-10-CM

## 2019-05-20 DIAGNOSIS — Z01419 Encounter for gynecological examination (general) (routine) without abnormal findings: Secondary | ICD-10-CM

## 2019-05-20 DIAGNOSIS — Z1239 Encounter for other screening for malignant neoplasm of breast: Secondary | ICD-10-CM

## 2019-05-20 MED ORDER — ESTRADIOL 0.1 MG/GM VA CREA: 1.0000 | TOPICAL_CREAM | VAGINAL | 2 refills | Status: DC

## 2019-05-20 NOTE — Progress Notes (Signed)
Gynecology Annual Exam  PCP: Patient, No Pcp Per  Chief Complaint:  Chief Complaint  Patient presents with  . Gynecologic Exam    History of Present Illness: Kari Gray is a 58 y.o. G3 P0030 WF who presents for her annual gyn exam. The patient has no gyn complaints. Has been using Estrace cream vaginally for vaginal dryness and dyspareunia with relief of symptoms. Currently uses the topical estrogen twice a week. She has a history of having a TVH in 2007 for pelvic pain and menorrhagia. She is postmenopausal. She denies significant  hot flashes or vaginal bleeding. Last pap smear: 05/17/2018 , results were NIL.Marland Kitchen No history of abnormal Pap smears.  Since her last visit 05/17/2018, she has had no other significant changes in her health. She reports that her mother, who is in a long term care facility, has just been diagnosed with Covid 19.   Her past medical history is remarkable for TMJ and recurrent SABs.  The patient does perform self breast exams. Her last mammogram was 05/17/2018, results were negative.   There is a family history of breast cancer in her MGM. Genetic testing has not been done.  There is no family history of ovarian cancer.   Last colonoscopy: 11/2013. Results: normal. Next one due in 10 years.  Dexa scan: 04/10/2005. Results: osteopenia  The patient denies smoking.  She has 2-3 glasses of wine a week.  She denies illegal drug use.  The patient reports exercising by walking..   Had a lipid panel done 2019 which was borderline elevated. Recommend repeating when fasting  The patient denies current symptoms of depression.    Review of Systems: Review of Systems  Constitutional: Negative for chills, fever and weight loss.  HENT: Negative for congestion, sinus pain and sore throat.   Eyes: Negative for blurred vision and pain.  Respiratory: Negative for hemoptysis, shortness of breath and wheezing.   Cardiovascular: Negative for chest pain,  palpitations and leg swelling.  Gastrointestinal: Negative for abdominal pain, blood in stool, diarrhea, heartburn, nausea and vomiting.  Genitourinary: Negative for dysuria, frequency, hematuria and urgency.  Musculoskeletal: Negative for back pain, joint pain and myalgias.  Skin: Negative for itching and rash.  Neurological: Negative for dizziness, tingling and headaches.  Endo/Heme/Allergies: Negative for environmental allergies and polydipsia. Does not bruise/bleed easily.       Negative for hirsutism   Psychiatric/Behavioral: Negative for depression. The patient is not nervous/anxious and does not have insomnia.     Past Medical History:  Past Medical History:  Diagnosis Date  . History of recurrent miscarriages   . Osteopenia   . TMJ (dislocation of temporomandibular joint)     Past Surgical History:  Past Surgical History:  Procedure Laterality Date  . COLONOSCOPY WITH ESOPHAGOGASTRODUODENOSCOPY (EGD)  11/2013  . DILATION AND CURETTAGE OF UTERUS  1999/ 2000/ 2007  . TMJ ARTHROPLASTY    . TONSILLECTOMY    . TUBAL LIGATION  2003   and diagnostic laparoscopy/ Dr Luella Cook  . VAGINAL HYSTERECTOMY  2007   AUB and pelvic pain    Family History:  Family History  Problem Relation Age of Onset  . Rheum arthritis Mother   . Lung cancer Mother 9  . Dementia Mother   . Stroke Father   . Breast cancer Maternal Grandmother 86  . Diabetes Cousin     Social History:  Social History   Socioeconomic History  . Marital status: Married    Spouse name:  Not on file  . Number of children: 0  . Years of education: Not on file  . Highest education level: Not on file  Occupational History  . Occupation: Business office  Tobacco Use  . Smoking status: Former Smoker    Quit date: 06/05/1997    Years since quitting: 21.9  . Smokeless tobacco: Never Used  Substance and Sexual Activity  . Alcohol use: Yes    Comment: socially  . Drug use: No  . Sexual activity: Yes    Partners:  Male    Birth control/protection: Post-menopausal  Other Topics Concern  . Not on file  Social History Narrative  . Not on file   Social Determinants of Health   Financial Resource Strain:   . Difficulty of Paying Living Expenses: Not on file  Food Insecurity:   . Worried About Programme researcher, broadcasting/film/videounning Out of Food in the Last Year: Not on file  . Ran Out of Food in the Last Year: Not on file  Transportation Needs:   . Lack of Transportation (Medical): Not on file  . Lack of Transportation (Non-Medical): Not on file  Physical Activity:   . Days of Exercise per Week: Not on file  . Minutes of Exercise per Session: Not on file  Stress:   . Feeling of Stress : Not on file  Social Connections:   . Frequency of Communication with Friends and Family: Not on file  . Frequency of Social Gatherings with Friends and Family: Not on file  . Attends Religious Services: Not on file  . Active Member of Clubs or Organizations: Not on file  . Attends BankerClub or Organization Meetings: Not on file  . Marital Status: Not on file  Intimate Partner Violence:   . Fear of Current or Ex-Partner: Not on file  . Emotionally Abused: Not on file  . Physically Abused: Not on file  . Sexually Abused: Not on file    Allergies:  Allergies  Allergen Reactions  . Codeine Other (See Comments)    Severe headaches.     Medications:  Current Outpatient Medications:  .  aspirin EC 81 MG tablet, Take by mouth., Disp: , Rfl:  .  [START ON 05/22/2019] estradiol (ESTRACE) 0.1 MG/GM vaginal cream, Place 1 Applicatorful vaginally 2 (two) times a week. Can use 2-3 times weekly, Disp: 42.5 g, Rfl: 2 .  Multiple Vitamin (MULTIVITAMIN) capsule, Take by mouth., Disp: , Rfl:  Physical Exam Vitals: BP 114/70   Pulse 63   Temp (!) 97.1 F (36.2 C)   Ht 5' 5.5" (1.664 m)   Wt 155 lb (70.3 kg)   BMI 25.40 kg/m   General: WF in NAD HEENT: normocephalic, anicteric Neck: no thyroid enlargement, no palpable nodules, no cervical  lymphadenopathy  Pulmonary: No increased work of breathing, CTAB Cardiovascular: RRR, without murmur  Breast: Breast symmetrical, no tenderness, no palpable nodules or masses, no skin or nipple retraction present, no nipple discharge.  No axillary, infraclavicular or supraclavicular lymphadenopathy. Abdomen: Soft, non-tender, non-distended.  Umbilicus without lesions.  No hepatomegaly or masses palpable. No evidence of hernia. Genitourinary:  External: Normal external female genitalia.  Normal urethral meatus, normal Bartholin's and Skene's glands.    Vagina: pale flattened rugae, no evidence of prolapse.    Cervix: surgically absent  Uterus: surgically absent  Adnexa: No adnexal masses, non-tender  Rectal: deferred  Lymphatic: no evidence of inguinal lymphadenopathy Extremities: no edema, erythema, or tenderness Neurologic: Grossly intact Psychiatric: mood appropriate, affect full  Assessment: 58 y.o. postmenopausal annual gyn exam Dyspareunia / vaginal dryness resolved with topical estrogen   1) Breast cancer screening - recommend monthly self breast exam and annual screening mammograms. Patient to schedule [facility] at [facility].  2)  Refilled Estrace    3) Pap not done. ASCCP guidelines and rational discussed.  Patient opts for every 3 years screening interval  4) Colon cancer screening: colonoscopy UTD  5) Routine healthcare maintenance labs including cholesterol and diabetes UTD   6) Osteoporosis prevention: Aware of calcium and vitamin D3 requirements as well as the role of exercise in osteoporosis prevention. Declines Dexa scan this year.  Dalia Heading, CNM

## 2019-06-03 DIAGNOSIS — Z1231 Encounter for screening mammogram for malignant neoplasm of breast: Secondary | ICD-10-CM | POA: Diagnosis not present

## 2020-05-24 ENCOUNTER — Other Ambulatory Visit: Payer: Self-pay

## 2020-05-24 ENCOUNTER — Encounter: Payer: Self-pay | Admitting: Obstetrics and Gynecology

## 2020-05-24 ENCOUNTER — Ambulatory Visit (INDEPENDENT_AMBULATORY_CARE_PROVIDER_SITE_OTHER): Payer: BC Managed Care – PPO | Admitting: Obstetrics and Gynecology

## 2020-05-24 VITALS — BP 130/70 | Ht 64.5 in | Wt 156.0 lb

## 2020-05-24 DIAGNOSIS — Z1231 Encounter for screening mammogram for malignant neoplasm of breast: Secondary | ICD-10-CM | POA: Diagnosis not present

## 2020-05-24 DIAGNOSIS — N952 Postmenopausal atrophic vaginitis: Secondary | ICD-10-CM

## 2020-05-24 DIAGNOSIS — Z01419 Encounter for gynecological examination (general) (routine) without abnormal findings: Secondary | ICD-10-CM

## 2020-05-24 DIAGNOSIS — Z13220 Encounter for screening for lipoid disorders: Secondary | ICD-10-CM | POA: Diagnosis not present

## 2020-05-24 DIAGNOSIS — Z0000 Encounter for general adult medical examination without abnormal findings: Secondary | ICD-10-CM | POA: Diagnosis not present

## 2020-05-24 NOTE — Progress Notes (Signed)
PCP: Patient, No Pcp Per   Chief Complaint  Patient presents with   Gynecologic Exam    No concerns    HPI:      Kari Gray is a 59 y.o. G3P0030 whose LMP was No LMP recorded. Patient has had a hysterectomy., presents today for her annual examination.  Her menses are absent due to [facility] I 2007 for pelvic pain and menorrhagia. No PMB, no vasomotor sx.   Sex activity: infrequently active with single partner, contraception - status post hysterectomy. She does have vaginal dryness, improved with estrace. Not using currently due to Rx ran out but ok without it.  Last Pap: 05/17/18  Results were: no abnormalities ; no longer indicated due to [facility]; no hx of abn paps  Last mammogram: 06/03/19  Results were: normal--routine follow-up in 12 months There is a FH of breast cancer in her MGM, genetic testing not indicated. There is no FH of ovarian cancer. The patient does do self-breast exams.  Colonoscopy: 2015 without abnormalities; Repeat due after 10 years.   Tobacco use: The patient denies current or previous tobacco use. Alcohol use: social drinker  No drug use Exercise: moderately active  She does get adequate calcium but not Vitamin D in her diet. Borderline lipids 2019 and 2020. Due for repeat fasting labs.   Past Medical History:  Diagnosis Date   History of recurrent miscarriages    Osteopenia    TMJ (dislocation of temporomandibular joint)     Past Surgical History:  Procedure Laterality Date   COLONOSCOPY WITH ESOPHAGOGASTRODUODENOSCOPY (EGD)  11/2013   DILATION AND CURETTAGE OF UTERUS  1999/ 2000/ 2007   TMJ ARTHROPLASTY     TONSILLECTOMY     TUBAL LIGATION  2003   and diagnostic laparoscopy/ Dr Luella Cook   VAGINAL HYSTERECTOMY  2007   AUB and pelvic pain    Family History  Problem Relation Age of Onset   Rheum arthritis Mother    Lung cancer Mother 49   Dementia Mother    Stroke Father    Breast cancer Maternal Grandmother 18   Diabetes  Cousin     Social History   Socioeconomic History   Marital status: Married    Spouse name: Not on file   Number of children: 0   Years of education: Not on file   Highest education level: Not on file  Occupational History   Occupation: Business office  Tobacco Use   Smoking status: Former Smoker    Quit date: 06/05/1997    Years since quitting: 22.9   Smokeless tobacco: Never Used  Building services engineer Use: Never used  Substance and Sexual Activity   Alcohol use: Yes    Comment: socially   Drug use: No   Sexual activity: Yes    Partners: Male    Birth control/protection: Post-menopausal  Other Topics Concern   Not on file  Social History Narrative   Not on file   Social Determinants of Health   Financial Resource Strain: Not on file  Food Insecurity: Not on file  Transportation Needs: Not on file  Physical Activity: Not on file  Stress: Not on file  Social Connections: Not on file  Intimate Partner Violence: Not on file     Current Outpatient Medications:    aspirin EC 81 MG tablet, Take by mouth., Disp: , Rfl:    Multiple Vitamin (MULTIVITAMIN) capsule, Take by mouth., Disp: , Rfl:    estradiol (ESTRACE) 0.1 MG/GM  vaginal cream, Place 1 Applicatorful vaginally 2 (two) times a week. Can use 2-3 times weekly (Patient not taking: Reported on 05/24/2020), Disp: 42.5 g, Rfl: 2     ROS:  Review of Systems  Constitutional: Negative for fatigue, fever and unexpected weight change.  Respiratory: Negative for cough, shortness of breath and wheezing.   Cardiovascular: Negative for chest pain, palpitations and leg swelling.  Gastrointestinal: Negative for blood in stool, constipation, diarrhea, nausea and vomiting.  Endocrine: Negative for cold intolerance, heat intolerance and polyuria.  Genitourinary: Negative for dyspareunia, dysuria, flank pain, frequency, genital sores, hematuria, menstrual problem, pelvic pain, urgency, vaginal bleeding, vaginal  discharge and vaginal pain.  Musculoskeletal: Negative for back pain, joint swelling and myalgias.  Skin: Negative for rash.  Neurological: Negative for dizziness, syncope, light-headedness, numbness and headaches.  Hematological: Negative for adenopathy.  Psychiatric/Behavioral: Negative for agitation, confusion, sleep disturbance and suicidal ideas. The patient is not nervous/anxious.    BREAST: No symptoms    Objective: BP 130/70    Ht 5' 4.5" (1.638 m)    Wt 156 lb (70.8 kg)    BMI 26.36 kg/m    Physical Exam Constitutional:      Appearance: She is well-developed.  Genitourinary:     Vulva normal.     Right Labia: No rash, tenderness or lesions.    Left Labia: No tenderness, lesions or rash.    Vaginal cuff intact.    No vaginal discharge, erythema or tenderness.      Right Adnexa: not tender and no mass present.    Left Adnexa: not tender and no mass present.    Cervix is absent.     Uterus is absent.  Breasts:     Right: No mass, nipple discharge, skin change or tenderness.     Left: No mass, nipple discharge, skin change or tenderness.    Neck:     Thyroid: No thyromegaly.  Cardiovascular:     Rate and Rhythm: Normal rate and regular rhythm.     Heart sounds: Normal heart sounds. No murmur heard.   Pulmonary:     Effort: Pulmonary effort is normal.     Breath sounds: Normal breath sounds.  Abdominal:     Palpations: Abdomen is soft.     Tenderness: There is no abdominal tenderness. There is no guarding or rebound.  Musculoskeletal:        General: Normal range of motion.     Cervical back: Normal range of motion.  Lymphadenopathy:     Cervical: No cervical adenopathy.  Neurological:     General: No focal deficit present.     Mental Status: She is alert and oriented to person, place, and time.     Cranial Nerves: No cranial nerve deficit.  Skin:    General: Skin is warm and dry.  Psychiatric:        Mood and Affect: Mood normal.        Behavior:  Behavior normal.        Thought Content: Thought content normal.        Judgment: Judgment normal.  Vitals reviewed.     Assessment/Plan:  Encounter for annual routine gynecological examination  Encounter for screening mammogram for malignant neoplasm of breast - Plan: MM 3D SCREEN BREAST BILATERAL; pt has mammo today  Vaginal atrophy--pt to call for estrace RF prn.   Blood tests for routine general physical examination - Plan: Comprehensive metabolic panel, Lipid panel  Screening cholesterol level - Plan: Lipid panel  GYN counsel breast self exam, mammography screening, menopause, adequate intake of calcium and vitamin D, diet and exercise    F/U  Return in about 1 year (around 05/24/2021).  Nastassia Bazaldua B. Aminah Zabawa, PA-C 05/24/2020 4:04 PM

## 2020-05-24 NOTE — Patient Instructions (Signed)
I value your feedback and entrusting us with your care. If you get a [facility] patient survey, I would appreciate you taking the time to let us know about your experience today. Thank you!  As of May 15, 2019, your lab results will be released to your MyChart immediately, before I even have a chance to see them. Please give me time to review them and contact you if there are any abnormalities. Thank you for your patience.     [facility]: [phone]  

## 2020-06-03 ENCOUNTER — Other Ambulatory Visit: Payer: BC Managed Care – PPO

## 2020-06-03 ENCOUNTER — Encounter: Payer: Self-pay | Admitting: Obstetrics and Gynecology

## 2020-06-03 ENCOUNTER — Other Ambulatory Visit: Payer: Self-pay

## 2020-06-03 DIAGNOSIS — Z13220 Encounter for screening for lipoid disorders: Secondary | ICD-10-CM

## 2020-06-03 DIAGNOSIS — Z0000 Encounter for general adult medical examination without abnormal findings: Secondary | ICD-10-CM | POA: Diagnosis not present

## 2020-06-03 DIAGNOSIS — Z1231 Encounter for screening mammogram for malignant neoplasm of breast: Secondary | ICD-10-CM | POA: Diagnosis not present

## 2020-06-04 LAB — COMPREHENSIVE METABOLIC PANEL
ALT: 15 IU/L (ref 0–32)
AST: 17 IU/L (ref 0–40)
Albumin/Globulin Ratio: 1.8 (ref 1.2–2.2)
Albumin: 4.4 g/dL (ref 3.8–4.9)
Alkaline Phosphatase: 62 IU/L (ref 44–121)
BUN/Creatinine Ratio: 17 (ref 9–23)
BUN: 12 mg/dL (ref 6–24)
Bilirubin Total: 0.4 mg/dL (ref 0.0–1.2)
CO2: 22 mmol/L (ref 20–29)
Calcium: 9.4 mg/dL (ref 8.7–10.2)
Chloride: 107 mmol/L — ABNORMAL HIGH (ref 96–106)
Creatinine, Ser: 0.69 mg/dL (ref 0.57–1.00)
GFR calc Af Amer: 112 mL/min/{1.73_m2} (ref >59)
GFR calc non Af Amer: 97 mL/min/{1.73_m2} (ref >59)
Globulin, Total: 2.5 g/dL (ref 1.5–4.5)
Glucose: 86 mg/dL (ref 65–99)
Potassium: 4.8 mmol/L (ref 3.5–5.2)
Sodium: 142 mmol/L (ref 134–144)
Total Protein: 6.9 g/dL (ref 6.0–8.5)

## 2020-06-04 LAB — LIPID PANEL
Chol/HDL Ratio: 3.5 ratio (ref 0.0–4.4)
Cholesterol, Total: 225 mg/dL — ABNORMAL HIGH (ref 100–199)
HDL: 65 mg/dL (ref >39)
LDL Chol Calc (NIH): 128 mg/dL — ABNORMAL HIGH (ref 0–99)
Triglycerides: 182 mg/dL — ABNORMAL HIGH (ref 0–149)
VLDL Cholesterol Cal: 32 mg/dL (ref 5–40)

## 2020-06-08 ENCOUNTER — Encounter: Payer: Self-pay | Admitting: Obstetrics and Gynecology

## 2021-05-24 DIAGNOSIS — E782 Mixed hyperlipidemia: Secondary | ICD-10-CM | POA: Insufficient documentation

## 2021-05-24 NOTE — Progress Notes (Signed)
PCP: Patient, No Pcp Per (Inactive)   Chief Complaint  Patient presents with   Gynecologic Exam    No concerns    HPI:      Ms. Kari Gray is a 60 y.o. G3P0030 whose LMP was No LMP recorded. Patient has had a hysterectomy., presents today for her annual examination.  Her menses are absent due to [facility] 2007 for pelvic pain and menorrhagia. No PMB, no vasomotor sx.   Sex activity: infrequently active with single partner, contraception - status post hysterectomy. She does not have vaginal dryness, improved with estrace in past, doesn't need it now.  Last Pap: 05/17/18  Results were: no abnormalities ; no longer indicated due to [facility]; no hx of abn paps  Last mammogram: 06/03/20 at [facility];  Results were: normal--routine follow-up in 12 months. Has 1/23 appt There is a FH of breast cancer in her MGM, genetic testing not indicated. There is no FH of ovarian cancer. The patient does self-breast exams.  Colonoscopy: 2015 without abnormalities; Repeat due after 10 years.   Tobacco use: The patient denies current or previous tobacco use. Alcohol use: social drinker  No drug use Exercise: moderately active  She does get adequate calcium and Vitamin D in her diet. Borderline lipids 2019-2021. Due for repeat fasting labs, not fasting today   Past Medical History:  Diagnosis Date   History of recurrent miscarriages    Osteopenia    TMJ (dislocation of temporomandibular joint)     Past Surgical History:  Procedure Laterality Date   COLONOSCOPY WITH ESOPHAGOGASTRODUODENOSCOPY (EGD)  11/2013   DILATION AND CURETTAGE OF UTERUS  1999/ 2000/ 2007   TMJ ARTHROPLASTY     TONSILLECTOMY     TUBAL LIGATION  2003   and diagnostic laparoscopy/ Dr Laurey Morale   VAGINAL HYSTERECTOMY  2007   AUB and pelvic pain    Family History  Problem Relation Age of Onset   Rheum arthritis Mother    Lung cancer Mother 84   Dementia Mother    Stroke Father    Breast cancer Maternal Grandmother 40    Diabetes Cousin     Social History   Socioeconomic History   Marital status: Married    Spouse name: Not on file   Number of children: 0   Years of education: Not on file   Highest education level: Not on file  Occupational History   Occupation: Business office  Tobacco Use   Smoking status: Former    Types: Cigarettes    Quit date: 06/05/1997    Years since quitting: 23.9   Smokeless tobacco: Never  Vaping Use   Vaping Use: Never used  Substance and Sexual Activity   Alcohol use: Yes    Comment: socially   Drug use: No   Sexual activity: Yes    Partners: Male    Birth control/protection: Post-menopausal  Other Topics Concern   Not on file  Social History Narrative   Not on file   Social Determinants of Health   Financial Resource Strain: Not on file  Food Insecurity: Not on file  Transportation Needs: Not on file  Physical Activity: Not on file  Stress: Not on file  Social Connections: Not on file  Intimate Partner Violence: Not on file     Current Outpatient Medications:    aspirin EC 81 MG tablet, Take by mouth., Disp: , Rfl:    Multiple Vitamin (MULTIVITAMIN) capsule, Take by mouth., Disp: , Rfl:    POTASSIUM PO,  Take by mouth., Disp: , Rfl:    VITAMIN D PO, Take by mouth., Disp: , Rfl:      ROS:  Review of Systems  Constitutional:  Negative for fatigue, fever and unexpected weight change.  Respiratory:  Negative for cough, shortness of breath and wheezing.   Cardiovascular:  Negative for chest pain, palpitations and leg swelling.  Gastrointestinal:  Negative for blood in stool, constipation, diarrhea, nausea and vomiting.  Endocrine: Negative for cold intolerance, heat intolerance and polyuria.  Genitourinary:  Negative for dyspareunia, dysuria, flank pain, frequency, genital sores, hematuria, menstrual problem, pelvic pain, urgency, vaginal bleeding, vaginal discharge and vaginal pain.  Musculoskeletal:  Negative for back pain, joint swelling and  myalgias.  Skin:  Negative for rash.  Neurological:  Negative for dizziness, syncope, light-headedness, numbness and headaches.  Hematological:  Negative for adenopathy.  Psychiatric/Behavioral:  Negative for agitation, confusion, sleep disturbance and suicidal ideas. The patient is not nervous/anxious.   BREAST: No symptoms    Objective: BP 130/80    Ht 5\' 4"  (1.626 m)    Wt 154 lb (69.9 kg)    BMI 26.43 kg/m    Physical Exam Constitutional:      Appearance: She is well-developed.  Genitourinary:     Vulva normal.     Genitourinary Comments: UTERUS/CX SURG REM     Right Labia: No rash, tenderness or lesions.    Left Labia: No tenderness, lesions or rash.    Vaginal cuff intact.    No vaginal discharge, erythema or tenderness.      Right Adnexa: not tender and no mass present.    Left Adnexa: not tender and no mass present.    Cervix is absent.     Uterus is absent.  Breasts:    Right: No mass, nipple discharge, skin change or tenderness.     Left: No mass, nipple discharge, skin change or tenderness.  Neck:     Thyroid: No thyromegaly.  Cardiovascular:     Rate and Rhythm: Normal rate and regular rhythm.     Heart sounds: Normal heart sounds. No murmur heard. Pulmonary:     Effort: Pulmonary effort is normal.     Breath sounds: Normal breath sounds.  Abdominal:     Palpations: Abdomen is soft.     Tenderness: There is no abdominal tenderness. There is no guarding or rebound.  Musculoskeletal:        General: Normal range of motion.     Cervical back: Normal range of motion.  Lymphadenopathy:     Cervical: No cervical adenopathy.  Neurological:     General: No focal deficit present.     Mental Status: She is alert and oriented to person, place, and time.     Cranial Nerves: No cranial nerve deficit.  Skin:    General: Skin is warm and dry.  Psychiatric:        Mood and Affect: Mood normal.        Behavior: Behavior normal.        Thought Content: Thought  content normal.        Judgment: Judgment normal.  Vitals reviewed.    Assessment/Plan: Encounter for annual routine gynecological examination  Encounter for screening mammogram for malignant neoplasm of breast - Plan: MM 3D SCREEN BREAST BILATERAL; pt has mammo sched  Blood tests for routine general physical examination - Plan: Comprehensive metabolic panel, Lipid panel,   Screening cholesterol level - Plan: Lipid panel,   Mixed hyperlipidemia - Plan:  Lipid panel,           GYN counsel breast self exam, mammography screening, menopause, adequate intake of calcium and vitamin D, diet and exercise    F/U  Return in about 1 year (around 05/25/2022).  Oyuki Hogan B. Karsyn Jamie, PA-C 05/25/2021 10:02 AM

## 2021-05-25 ENCOUNTER — Other Ambulatory Visit: Payer: Self-pay

## 2021-05-25 ENCOUNTER — Ambulatory Visit (INDEPENDENT_AMBULATORY_CARE_PROVIDER_SITE_OTHER): Payer: BC Managed Care – PPO | Admitting: Obstetrics and Gynecology

## 2021-05-25 ENCOUNTER — Encounter: Payer: Self-pay | Admitting: Obstetrics and Gynecology

## 2021-05-25 VITALS — BP 130/80 | Ht 64.0 in | Wt 154.0 lb

## 2021-05-25 DIAGNOSIS — Z01419 Encounter for gynecological examination (general) (routine) without abnormal findings: Secondary | ICD-10-CM

## 2021-05-25 DIAGNOSIS — Z1231 Encounter for screening mammogram for malignant neoplasm of breast: Secondary | ICD-10-CM

## 2021-05-25 DIAGNOSIS — Z0000 Encounter for general adult medical examination without abnormal findings: Secondary | ICD-10-CM | POA: Diagnosis not present

## 2021-05-25 DIAGNOSIS — Z13220 Encounter for screening for lipoid disorders: Secondary | ICD-10-CM | POA: Diagnosis not present

## 2021-05-25 DIAGNOSIS — E782 Mixed hyperlipidemia: Secondary | ICD-10-CM

## 2021-05-25 NOTE — Patient Instructions (Signed)
I value your feedback and you entrusting us with your care. If you get a [facility] patient survey, I would appreciate you taking the time to let us know about your experience today. Thank you!   [facility]: [phone]  

## 2021-06-10 DIAGNOSIS — Z1231 Encounter for screening mammogram for malignant neoplasm of breast: Secondary | ICD-10-CM | POA: Diagnosis not present

## 2021-06-13 ENCOUNTER — Encounter: Payer: Self-pay | Admitting: Obstetrics and Gynecology

## 2021-09-14 DIAGNOSIS — S93401A Sprain of unspecified ligament of right ankle, initial encounter: Secondary | ICD-10-CM | POA: Diagnosis not present

## 2021-09-14 DIAGNOSIS — S92341A Displaced fracture of fourth metatarsal bone, right foot, initial encounter for closed fracture: Secondary | ICD-10-CM | POA: Diagnosis not present

## 2021-09-23 DIAGNOSIS — S92341A Displaced fracture of fourth metatarsal bone, right foot, initial encounter for closed fracture: Secondary | ICD-10-CM | POA: Diagnosis not present

## 2021-10-21 DIAGNOSIS — S92341A Displaced fracture of fourth metatarsal bone, right foot, initial encounter for closed fracture: Secondary | ICD-10-CM | POA: Diagnosis not present

## 2022-05-29 NOTE — Progress Notes (Unsigned)
PCP: Patient, No Pcp Per   No chief complaint on file.   HPI:      Ms. Kari Gray is a 61 y.o. G3P0030 whose LMP was No LMP recorded. Patient has had a hysterectomy., presents today for her annual examination.  Her menses are absent due to [facility] 2007 for pelvic pain and menorrhagia. No PMB, no vasomotor sx.   Sex activity: infrequently active with single partner, contraception - status post hysterectomy. She does not have vaginal dryness, improved with estrace in past, doesn't need it now.  Last Pap: 05/17/18  Results were: no abnormalities ; no longer indicated due to [facility]; no hx of abn paps  Last mammogram: 06/10/21 at [facility];  Results were: normal--routine follow-up in 12 months. There is a FH of breast cancer in her MGM, genetic testing not indicated. There is no FH of ovarian cancer. The patient does self-breast exams.  Colonoscopy: 2015 without abnormalities; Repeat due after 10 years.   Tobacco use: The patient denies current or previous tobacco use. Alcohol use: social drinker  No drug use Exercise: moderately active  She does get adequate calcium and Vitamin D in her diet. Borderline lipids 2019-2021. Due for repeat fasting labs, not fasting today   Past Medical History:  Diagnosis Date   History of recurrent miscarriages    Osteopenia    TMJ (dislocation of temporomandibular joint)     Past Surgical History:  Procedure Laterality Date   COLONOSCOPY WITH ESOPHAGOGASTRODUODENOSCOPY (EGD)  11/2013   DILATION AND CURETTAGE OF UTERUS  1999/ 2000/ 2007   TMJ ARTHROPLASTY     TONSILLECTOMY     TUBAL LIGATION  2003   and diagnostic laparoscopy/ Dr Luella Cook   VAGINAL HYSTERECTOMY  2007   AUB and pelvic pain    Family History  Problem Relation Age of Onset   Rheum arthritis Mother    Lung cancer Mother 65   Dementia Mother    Stroke Father    Breast cancer Maternal Grandmother 31   Diabetes Cousin     Social History   Socioeconomic History   Marital status:  Married    Spouse name: Not on file   Number of children: 0   Years of education: Not on file   Highest education level: Not on file  Occupational History   Occupation: Business office  Tobacco Use   Smoking status: Former    Types: Cigarettes    Quit date: 06/05/1997    Years since quitting: 24.9   Smokeless tobacco: Never  Vaping Use   Vaping Use: Never used  Substance and Sexual Activity   Alcohol use: Yes    Comment: socially   Drug use: No   Sexual activity: Yes    Partners: Male    Birth control/protection: Post-menopausal  Other Topics Concern   Not on file  Social History Narrative   Not on file   Social Determinants of Health   Financial Resource Strain: Not on file  Food Insecurity: Not on file  Transportation Needs: Not on file  Physical Activity: Not on file  Stress: Not on file  Social Connections: Not on file  Intimate Partner Violence: Not on file     Current Outpatient Medications:    aspirin EC 81 MG tablet, Take by mouth., Disp: , Rfl:    Multiple Vitamin (MULTIVITAMIN) capsule, Take by mouth., Disp: , Rfl:    POTASSIUM PO, Take by mouth., Disp: , Rfl:    VITAMIN D PO, Take by mouth.,  Disp: , Rfl:      ROS:  Review of Systems  Constitutional:  Negative for fatigue, fever and unexpected weight change.  Respiratory:  Negative for cough, shortness of breath and wheezing.   Cardiovascular:  Negative for chest pain, palpitations and leg swelling.  Gastrointestinal:  Negative for blood in stool, constipation, diarrhea, nausea and vomiting.  Endocrine: Negative for cold intolerance, heat intolerance and polyuria.  Genitourinary:  Negative for dyspareunia, dysuria, flank pain, frequency, genital sores, hematuria, menstrual problem, pelvic pain, urgency, vaginal bleeding, vaginal discharge and vaginal pain.  Musculoskeletal:  Negative for back pain, joint swelling and myalgias.  Skin:  Negative for rash.  Neurological:  Negative for dizziness,  syncope, light-headedness, numbness and headaches.  Hematological:  Negative for adenopathy.  Psychiatric/Behavioral:  Negative for agitation, confusion, sleep disturbance and suicidal ideas. The patient is not nervous/anxious.    BREAST: No symptoms    Objective: There were no vitals taken for this visit.   Physical Exam Constitutional:      Appearance: She is well-developed.  Genitourinary:     Vulva normal.     Genitourinary Comments: UTERUS/CX SURG REM     Right Labia: No rash, tenderness or lesions.    Left Labia: No tenderness, lesions or rash.    Vaginal cuff intact.    No vaginal discharge, erythema or tenderness.      Right Adnexa: not tender and no mass present.    Left Adnexa: not tender and no mass present.    Cervix is absent.     Uterus is absent.  Breasts:    Right: No mass, nipple discharge, skin change or tenderness.     Left: No mass, nipple discharge, skin change or tenderness.  Neck:     Thyroid: No thyromegaly.  Cardiovascular:     Rate and Rhythm: Normal rate and regular rhythm.     Heart sounds: Normal heart sounds. No murmur heard. Pulmonary:     Effort: Pulmonary effort is normal.     Breath sounds: Normal breath sounds.  Abdominal:     Palpations: Abdomen is soft.     Tenderness: There is no abdominal tenderness. There is no guarding or rebound.  Musculoskeletal:        General: Normal range of motion.     Cervical back: Normal range of motion.  Lymphadenopathy:     Cervical: No cervical adenopathy.  Neurological:     General: No focal deficit present.     Mental Status: She is alert and oriented to person, place, and time.     Cranial Nerves: No cranial nerve deficit.  Skin:    General: Skin is warm and dry.  Psychiatric:        Mood and Affect: Mood normal.        Behavior: Behavior normal.        Thought Content: Thought content normal.        Judgment: Judgment normal.  Vitals reviewed.     Assessment/Plan: Encounter for  annual routine gynecological examination  Encounter for screening mammogram for malignant neoplasm of breast - Plan: MM 3D SCREEN BREAST BILATERAL; pt has mammo sched  Blood tests for routine general physical examination - Plan: Comprehensive metabolic panel, Lipid panel,   Screening cholesterol level - Plan: Lipid panel,   Mixed hyperlipidemia - Plan: Lipid panel,           GYN counsel breast self exam, mammography screening, menopause, adequate intake of calcium and vitamin D, diet and exercise  F/U  No follow-ups on file.  Tilly Pernice B. Nathon Stefanski, PA-C 05/29/2022 1:56 PM

## 2022-05-30 ENCOUNTER — Ambulatory Visit: Payer: 59 | Admitting: Obstetrics and Gynecology

## 2022-05-31 ENCOUNTER — Ambulatory Visit (INDEPENDENT_AMBULATORY_CARE_PROVIDER_SITE_OTHER): Payer: BC Managed Care – PPO | Admitting: Obstetrics and Gynecology

## 2022-05-31 ENCOUNTER — Encounter: Payer: Self-pay | Admitting: Obstetrics and Gynecology

## 2022-05-31 VITALS — BP 126/82 | Ht 65.0 in | Wt 154.4 lb

## 2022-05-31 DIAGNOSIS — Z1231 Encounter for screening mammogram for malignant neoplasm of breast: Secondary | ICD-10-CM

## 2022-05-31 DIAGNOSIS — Z13220 Encounter for screening for lipoid disorders: Secondary | ICD-10-CM

## 2022-05-31 DIAGNOSIS — E782 Mixed hyperlipidemia: Secondary | ICD-10-CM

## 2022-05-31 DIAGNOSIS — Z0000 Encounter for general adult medical examination without abnormal findings: Secondary | ICD-10-CM | POA: Diagnosis not present

## 2022-05-31 DIAGNOSIS — Z01419 Encounter for gynecological examination (general) (routine) without abnormal findings: Secondary | ICD-10-CM

## 2022-05-31 NOTE — Patient Instructions (Signed)
I value your feedback and you entrusting us with your care. If you get a [facility] patient survey, I would appreciate you taking the time to let us know about your experience today. Thank you!   [facility]: [phone]  

## 2022-06-01 LAB — COMPREHENSIVE METABOLIC PANEL
ALT: 13 IU/L (ref 0–32)
AST: 17 IU/L (ref 0–40)
Albumin/Globulin Ratio: 1.7 (ref 1.2–2.2)
Albumin: 4.5 g/dL (ref 3.8–4.9)
Alkaline Phosphatase: 64 IU/L (ref 44–121)
BUN/Creatinine Ratio: 15 (ref 9–23)
BUN: 10 mg/dL (ref 6–24)
Bilirubin Total: 0.5 mg/dL (ref 0.0–1.2)
CO2: 22 mmol/L (ref 20–29)
Calcium: 9.7 mg/dL (ref 8.7–10.2)
Chloride: 104 mmol/L (ref 96–106)
Creatinine, Ser: 0.68 mg/dL (ref 0.57–1.00)
Globulin, Total: 2.6 g/dL (ref 1.5–4.5)
Glucose: 95 mg/dL (ref 70–99)
Potassium: 4.4 mmol/L (ref 3.5–5.2)
Sodium: 141 mmol/L (ref 134–144)
Total Protein: 7.1 g/dL (ref 6.0–8.5)
eGFR: 100 mL/min/{1.73_m2} (ref >59)

## 2022-06-01 LAB — LIPID PANEL
Chol/HDL Ratio: 3.4 ratio (ref 0.0–4.4)
Cholesterol, Total: 232 mg/dL — ABNORMAL HIGH (ref 100–199)
HDL: 69 mg/dL (ref >39)
LDL Chol Calc (NIH): 139 mg/dL — ABNORMAL HIGH (ref 0–99)
Triglycerides: 138 mg/dL (ref 0–149)
VLDL Cholesterol Cal: 24 mg/dL (ref 5–40)

## 2022-06-12 ENCOUNTER — Encounter: Payer: Self-pay | Admitting: Obstetrics and Gynecology

## 2022-06-12 DIAGNOSIS — Z1231 Encounter for screening mammogram for malignant neoplasm of breast: Secondary | ICD-10-CM | POA: Diagnosis not present

## 2022-10-04 ENCOUNTER — Ambulatory Visit
Admission: EM | Admit: 2022-10-04 | Discharge: 2022-10-04 | Disposition: A | Discharge status: Home / Self Care | Payer: BC Managed Care – PPO | Attending: Urgent Care | Admitting: Urgent Care

## 2022-10-04 DIAGNOSIS — R6889 Other general symptoms and signs: Secondary | ICD-10-CM | POA: Diagnosis not present

## 2022-10-04 MED ORDER — ONDANSETRON 4 MG PO TBDP
4.0000 mg | ORAL_TABLET | Freq: Once | ORAL | Status: AC
  Administered 2022-10-04: 4 mg via ORAL

## 2022-10-04 MED ORDER — ONDANSETRON 4 MG PO TBDP: 4.0000 mg | ORAL_TABLET | Freq: Three times a day (TID) | ORAL | 0 refills | Status: DC | PRN

## 2022-10-04 MED ORDER — ONDANSETRON 4 MG PO TBDP: 4.0000 mg | ORAL_TABLET | Freq: Once | ORAL | Status: DC

## 2022-10-04 NOTE — ED Provider Notes (Signed)
Kari Gray    CSN: [id number] Arrival date & time: 10/04/22  0816      History   Chief Complaint Chief Complaint  Patient presents with   Emesis   Headache    HPI Kari Gray is a 62 y.o. female.    Emesis Associated symptoms: headaches   Headache Associated symptoms: vomiting     Patient presents to urgent care with complaint of flulike symptoms x 3 days.  She reports symptoms of fever, nausea, vomiting, headache.  She reports the headache started 6 days ago.  Photophobia is present.   Denies history of frequent headache.  She does report taking a new homeopathic supplement called slenderiz to promote weight loss.  She started this treatment about 2 weeks ago and headache started about a week later.  She states she has stopped taking this medicine now for about a week.  Home COVID test is negative.  Taking Tylenol every 4-5 hours for fever control.  She reports difficulty tolerating fluids.  Past Medical History:  Diagnosis Date   History of recurrent miscarriages    Osteopenia    TMJ (dislocation of temporomandibular joint)     Patient Active Problem List   Diagnosis Date Noted   Mixed hyperlipidemia 05/24/2021   Vaginal atrophy 02/10/2017   Osteopenia 02/10/2017    Past Surgical History:  Procedure Laterality Date   COLONOSCOPY WITH ESOPHAGOGASTRODUODENOSCOPY (EGD)  11/2013   DILATION AND CURETTAGE OF UTERUS  1999/ 2000/ 2007   TMJ ARTHROPLASTY     TONSILLECTOMY     TUBAL LIGATION  2003   and diagnostic laparoscopy/ Dr Luella Cook   VAGINAL HYSTERECTOMY  2007   AUB and pelvic pain    OB History     Gravida  3   Para      Term      Preterm      AB  3   Living         SAB  3   IAB      Ectopic      Multiple      Live Births               Home Medications    Prior to Admission medications   Medication Sig Start Date End Date Taking? Authorizing Provider  aspirin EC 81 MG tablet Take by mouth.   Yes [provider]  cyanocobalamin (VITAMIN B12) 1000 MCG tablet Take 1,000 mcg by mouth daily.   Yes [provider]  Multiple Vitamin (MULTIVITAMIN) capsule Take by mouth.   Yes [provider]  POTASSIUM PO Take by mouth.   Yes [provider]  VITAMIN D PO Take by mouth.   Yes [provider]    Family History Family History  Problem Relation Age of Onset   Rheum arthritis Mother    Lung cancer Mother 34   Dementia Mother    Stroke Father    Breast cancer Maternal Grandmother 66   Diabetes Cousin     Social History Social History   Tobacco Use   Smoking status: Former    Types: Cigarettes    Quit date: 06/05/1997    Years since quitting: 25.3   Smokeless tobacco: Never  Vaping Use   Vaping Use: Never used  Substance Use Topics   Alcohol use: Yes    Comment: socially   Drug use: No     Allergies   Codeine   Review of Systems Review of Systems  Gastrointestinal:  Positive for vomiting.  Neurological:  Positive for headaches.     Physical Exam Triage Vital Signs ED Triage Vitals  Enc Vitals Group     BP 10/04/22 0851 121/80     Pulse Rate 10/04/22 0851 96     Resp 10/04/22 0851 18     Temp 10/04/22 0851 99.9 F (37.7 C)     Temp Source 10/04/22 0851 Oral     SpO2 10/04/22 0851 93 %     Weight --      Height --      Head Circumference --      Peak Flow --      Pain Score 10/04/22 0853 8     Pain Loc --      Pain Edu? --      Excl. in GC? --    No data found.  Updated Vital Signs BP 121/80 (BP Location: Right Arm)   Pulse 96   Temp 99.9 F (37.7 C) (Oral)   Resp 18   SpO2 93%   Visual Acuity Right Eye Distance:   Left Eye Distance:   Bilateral Distance:    Right Eye Near:   Left Eye Near:    Bilateral Near:     Physical Exam Vitals reviewed.  Constitutional:      Appearance: She is well-developed.  HENT:     Mouth/Throat:     Mouth: Mucous membranes are moist.     Pharynx: No oropharyngeal  exudate or posterior oropharyngeal erythema.     Tonsils: No tonsillar exudate.  Cardiovascular:     Rate and Rhythm: Normal rate and regular rhythm.     Heart sounds: Normal heart sounds.  Pulmonary:     Effort: Pulmonary effort is normal.     Breath sounds: Normal breath sounds.  Skin:    General: Skin is warm and dry.  Neurological:     Mental Status: She is alert and oriented to person, place, and time.  Psychiatric:        Mood and Affect: Mood normal.        Behavior: Behavior normal.      UC Treatments / Results  Labs (all labs ordered are listed, but only abnormal results are displayed) Labs Reviewed - No data to display  EKG   Radiology No results found.  Procedures Procedures (including critical care time)  Medications Ordered in UC Medications - No data to display  Initial Impression / Assessment and Plan / UC Course  I have reviewed the triage vital signs and the nursing notes.  Pertinent labs & imaging results that were available during my care of the patient were reviewed by me and considered in my medical decision making (see chart for details).   Kari Gray is a 62 y.o. female presenting with flu like symptoms. Patient with elevated temp of 99.9 F WITH recent antipyretics, satting well on room air. Overall is ill appearing though non-toxic, well hydrated, without respiratory distress. Pulmonary exam is unremarkable.  Lungs CTAB without wheezing, rhonchi, rales.  Patient's symptoms are consistent with an acute viral process.  She is outside the treatment window for influenza and so no test was performed.  Negative COVID and did not repeat in office.  Ondansetron ODT administered in clinic for nausea and will be discharged with a prescription for same.  Otherwise mended supportive care and continued use of OTC medication for symptom control.  Counseled patient on potential for adverse effects with medications prescribed/recommended today,  ER and  return-to-clinic precautions discussed, patient verbalized understanding and agreement with care plan.   Final Clinical Impressions(s) / UC Diagnoses   Final diagnoses:  None   Discharge Instructions   None    ED Prescriptions   None    PDMP not reviewed this encounter.   Charma Igo, [place] 10/04/22 [phone]

## 2022-10-04 NOTE — Discharge Instructions (Addendum)
Follow up here or with your primary care provider if your symptoms are worsening or not improving.     

## 2022-10-04 NOTE — ED Triage Notes (Signed)
Taking slinder IX started taking it 2 weeks ago then started having a headache that started a week ago, and stopped taking it. Now having fever 102 that started 2 days ago with nausea and vomiting, SOB. Doesn't know if its from new supplement or virus. Took home covid test yesterday and was negative.

## 2023-06-11 NOTE — Progress Notes (Signed)
 PCP: Patient, No Pcp Per   Chief Complaint  Patient presents with   Gynecologic Exam    No concerns    HPI:      Kari Gray is a 63 y.o. G3P0030 whose LMP was No LMP recorded. Patient has had a hysterectomy., presents today for her annual examination.  Her menses are absent due to [facility] 2007 for pelvic pain and menorrhagia. No PMB, no vasomotor sx.   Sex activity: infrequently active with single partner, contraception - status post hysterectomy. She does have vaginal dryness, improved with lubricants; did estrace  vag ERT in past, doesn't need it now.  Last Pap: 05/17/18  Results were: no abnormalities ; no longer indicated due to [facility]; no hx of abn paps  Last mammogram: 06/12/22 at [facility];  Results were: normal--routine follow-up in 12 months. There is a FH of breast cancer in her MGM, genetic testing not indicated. There is no FH of ovarian cancer. The patient does self-breast exams.  Colonoscopy: 11/2013 without abnormalities at [facility]; Repeat due after 10 years.   Tobacco use: The patient denies current or previous tobacco use. Alcohol use: social drinker  No drug use Exercise: moderately active  She does get adequate calcium and Vitamin D in her diet. Borderline lipids 2019-2021. Due for repeat fasting labs   Past Medical History:  Diagnosis Date   History of recurrent miscarriages    Osteopenia    TMJ (dislocation of temporomandibular joint)     Past Surgical History:  Procedure Laterality Date   COLONOSCOPY WITH ESOPHAGOGASTRODUODENOSCOPY (EGD)  11/2013   DILATION AND CURETTAGE OF UTERUS  1999/ 2000/ 2007   TMJ ARTHROPLASTY     TONSILLECTOMY     TUBAL LIGATION  2003   and diagnostic laparoscopy/ Dr Rolm   VAGINAL HYSTERECTOMY  2007   AUB and pelvic pain    Family History  Problem Relation Age of Onset   Rheum arthritis Mother    Lung cancer Mother 67   Dementia Mother    Stroke Father    Breast cancer Maternal Grandmother 27   Diabetes Cousin      Social History   Socioeconomic History   Marital status: Married    Spouse name: Not on file   Number of children: 0   Years of education: Not on file   Highest education level: Not on file  Occupational History   Occupation: Business office  Tobacco Use   Smoking status: Former    Current packs/day: 0.00    Types: Cigarettes    Quit date: 06/05/1997    Years since quitting: 26.0   Smokeless tobacco: Never  Vaping Use   Vaping status: Never Used  Substance and Sexual Activity   Alcohol use: Yes    Comment: socially   Drug use: No   Sexual activity: Yes    Partners: Male    Birth control/protection: Post-menopausal  Other Topics Concern   Not on file  Social History Narrative   Not on file   Social Drivers of Health   Financial Resource Strain: Not on file  Food Insecurity: Not on file  Transportation Needs: Not on file  Physical Activity: Not on file  Stress: Not on file  Social Connections: Not on file  Intimate Partner Violence: Not on file     Current Outpatient Medications:    aspirin EC 81 MG tablet, Take by mouth., Disp: , Rfl:    cyanocobalamin (VITAMIN B12) 1000 MCG tablet, Take 1,000 mcg by  mouth daily., Disp: , Rfl:    Multiple Vitamin (MULTIVITAMIN) capsule, Take by mouth., Disp: , Rfl:    POTASSIUM PO, Take by mouth., Disp: , Rfl:    VITAMIN D PO, Take by mouth., Disp: , Rfl:      ROS:  Review of Systems  Constitutional:  Negative for fatigue, fever and unexpected weight change.  Respiratory:  Negative for cough, shortness of breath and wheezing.   Cardiovascular:  Negative for chest pain, palpitations and leg swelling.  Gastrointestinal:  Negative for blood in stool, constipation, diarrhea, nausea and vomiting.  Endocrine: Negative for cold intolerance, heat intolerance and polyuria.  Genitourinary:  Negative for dyspareunia, dysuria, flank pain, frequency, genital sores, hematuria, menstrual problem, pelvic pain, urgency, vaginal  bleeding, vaginal discharge and vaginal pain.  Musculoskeletal:  Negative for back pain, joint swelling and myalgias.  Skin:  Negative for rash.  Neurological:  Negative for dizziness, syncope, light-headedness, numbness and headaches.  Hematological:  Negative for adenopathy.  Psychiatric/Behavioral:  Negative for agitation, confusion, sleep disturbance and suicidal ideas. The patient is not nervous/anxious.    BREAST: No symptoms    Objective: BP 126/68   Ht 5' 5 (1.651 m)   Wt 156 lb (70.8 kg)   BMI 25.96 kg/m    Physical Exam Constitutional:      Appearance: She is well-developed.  Genitourinary:     Vulva normal.     Genitourinary Comments: UTERUS/CX SURG REM     Right Labia: No rash, tenderness or lesions.    Left Labia: No tenderness, lesions or rash.    Vaginal cuff intact.    No vaginal discharge, erythema or tenderness.     Moderate vaginal atrophy present.     Right Adnexa: not tender and no mass present.    Left Adnexa: not tender and no mass present.    Cervix is absent.     Uterus is absent.  Breasts:    Right: No mass, nipple discharge, skin change or tenderness.     Left: No mass, nipple discharge, skin change or tenderness.  Neck:     Thyroid: No thyromegaly.  Cardiovascular:     Rate and Rhythm: Normal rate and regular rhythm.     Heart sounds: Normal heart sounds. No murmur heard. Pulmonary:     Effort: Pulmonary effort is normal.     Breath sounds: Normal breath sounds.  Abdominal:     Palpations: Abdomen is soft.     Tenderness: There is no abdominal tenderness. There is no guarding.  Musculoskeletal:        General: Normal range of motion.     Cervical back: Normal range of motion.  Neurological:     General: No focal deficit present.     Mental Status: She is alert and oriented to person, place, and time.     Cranial Nerves: No cranial nerve deficit.  Skin:    General: Skin is warm and dry.  Psychiatric:        Mood and Affect: Mood  normal.        Behavior: Behavior normal.        Thought Content: Thought content normal.        Judgment: Judgment normal.  Vitals reviewed.     Assessment/Plan: Encounter for annual routine gynecological examination  Encounter for screening mammogram for malignant neoplasm of breast - Plan: MM 3D SCREENING MAMMOGRAM BILATERAL BREAST; pt to schedule mammo  Screening for colon cancer - Plan: Ambulatory referral to Gastroenterology; refer  to [facility]  Blood tests for routine general physical examination - Plan: Comprehensive metabolic panel, CBC with Differential/Platelet, Hemoglobin A1c, Lipid panel, TSH, T4, free, T4, free, TSH, Lipid panel, Hemoglobin A1c, CBC with Differential/Platelet, Comprehensive metabolic panel  Elevated LDL cholesterol level - Plan: Lipid panel, Lipid panel  Screening for diabetes mellitus - Plan: Hemoglobin A1c, Hemoglobin A1c  Screening for deficiency anemia - Plan: CBC with Differential/Platelet, CBC with Differential/Platelet  Thyroid disorder screening - Plan: TSH, T4, free, T4, free, TSH           GYN counsel breast self exam, mammography screening, adequate intake of calcium and vitamin D, diet and exercise    F/U  Return in about 1 year (around 06/11/2024).  Dametria Tuzzolino B. Alecea Trego, PA-C 06/12/2023 8:59 AM

## 2023-06-12 ENCOUNTER — Ambulatory Visit (INDEPENDENT_AMBULATORY_CARE_PROVIDER_SITE_OTHER): Payer: BC Managed Care – PPO | Admitting: Obstetrics and Gynecology

## 2023-06-12 ENCOUNTER — Encounter: Payer: Self-pay | Admitting: Obstetrics and Gynecology

## 2023-06-12 VITALS — BP 126/68 | Ht 65.0 in | Wt 156.0 lb

## 2023-06-12 DIAGNOSIS — Z0000 Encounter for general adult medical examination without abnormal findings: Secondary | ICD-10-CM

## 2023-06-12 DIAGNOSIS — Z1211 Encounter for screening for malignant neoplasm of colon: Secondary | ICD-10-CM

## 2023-06-12 DIAGNOSIS — E782 Mixed hyperlipidemia: Secondary | ICD-10-CM

## 2023-06-12 DIAGNOSIS — Z01419 Encounter for gynecological examination (general) (routine) without abnormal findings: Secondary | ICD-10-CM

## 2023-06-12 DIAGNOSIS — Z131 Encounter for screening for diabetes mellitus: Secondary | ICD-10-CM | POA: Diagnosis not present

## 2023-06-12 DIAGNOSIS — Z130 Encounter for screening for diseases of the blood and blood-forming organs and certain disorders involving the immune mechanism: Secondary | ICD-10-CM

## 2023-06-12 DIAGNOSIS — E7800 Pure hypercholesterolemia, unspecified: Secondary | ICD-10-CM | POA: Diagnosis not present

## 2023-06-12 DIAGNOSIS — Z1329 Encounter for screening for other suspected endocrine disorder: Secondary | ICD-10-CM

## 2023-06-12 DIAGNOSIS — Z1231 Encounter for screening mammogram for malignant neoplasm of breast: Secondary | ICD-10-CM

## 2023-06-12 NOTE — Patient Instructions (Signed)
I value your feedback and you entrusting us with your care. If you get a [facility] patient survey, I would appreciate you taking the time to let us know about your experience today. Thank you!   [facility]: [phone]  

## 2023-06-13 LAB — COMPREHENSIVE METABOLIC PANEL
ALT: 16 [IU]/L (ref 0–32)
AST: 17 [IU]/L (ref 0–40)
Albumin: 4.5 g/dL (ref 3.8–4.9)
Alkaline Phosphatase: 73 [IU]/L (ref 44–121)
BUN/Creatinine Ratio: 19 (ref 12–28)
BUN: 13 mg/dL (ref 8–27)
Bilirubin Total: 0.5 mg/dL (ref 0.0–1.2)
CO2: 25 mmol/L (ref 20–29)
Calcium: 9.6 mg/dL (ref 8.7–10.3)
Chloride: 105 mmol/L (ref 96–106)
Creatinine, Ser: 0.7 mg/dL (ref 0.57–1.00)
Globulin, Total: 2.5 g/dL (ref 1.5–4.5)
Glucose: 95 mg/dL (ref 70–99)
Potassium: 4.3 mmol/L (ref 3.5–5.2)
Sodium: 142 mmol/L (ref 134–144)
Total Protein: 7 g/dL (ref 6.0–8.5)
eGFR: 99 mL/min/{1.73_m2} (ref >59)

## 2023-06-13 LAB — CBC WITH DIFFERENTIAL/PLATELET
Basophils Absolute: 0.1 10*3/uL (ref 0.0–0.2)
Basos: 1 %
EOS (ABSOLUTE): 0.1 10*3/uL (ref 0.0–0.4)
Eos: 2 %
Hematocrit: 41.8 % (ref 34.0–46.6)
Hemoglobin: 13.6 g/dL (ref 11.1–15.9)
Immature Grans (Abs): 0 10*3/uL (ref 0.0–0.1)
Immature Granulocytes: 0 %
Lymphocytes Absolute: 1.3 10*3/uL (ref 0.7–3.1)
Lymphs: 24 %
MCH: 31.4 pg (ref 26.6–33.0)
MCHC: 32.5 g/dL (ref 31.5–35.7)
MCV: 97 fL (ref 79–97)
Monocytes Absolute: 0.4 10*3/uL (ref 0.1–0.9)
Monocytes: 8 %
Neutrophils Absolute: 3.7 10*3/uL (ref 1.4–7.0)
Neutrophils: 65 %
Platelets: 258 10*3/uL (ref 150–450)
RBC: 4.33 x10E6/uL (ref 3.77–5.28)
RDW: 11.8 % (ref 11.7–15.4)
WBC: 5.6 10*3/uL (ref 3.4–10.8)

## 2023-06-13 LAB — LIPID PANEL
Chol/HDL Ratio: 3.8 {ratio} (ref 0.0–4.4)
Cholesterol, Total: 240 mg/dL — ABNORMAL HIGH (ref 100–199)
HDL: 63 mg/dL (ref >39)
LDL Chol Calc (NIH): 127 mg/dL — ABNORMAL HIGH (ref 0–99)
Triglycerides: 287 mg/dL — ABNORMAL HIGH (ref 0–149)
VLDL Cholesterol Cal: 50 mg/dL — ABNORMAL HIGH (ref 5–40)

## 2023-06-13 LAB — T4, FREE: Free T4: 1.01 ng/dL (ref 0.82–1.77)

## 2023-06-13 LAB — TSH: TSH: 2.51 u[IU]/mL (ref 0.450–4.500)

## 2023-06-13 LAB — HEMOGLOBIN A1C
Est. average glucose Bld gHb Est-mCnc: 100 mg/dL
Hgb A1c MFr Bld: 5.1 % (ref 4.8–5.6)

## 2023-06-14 NOTE — Addendum Note (Signed)
 Addended by: Althea Grimmer B on: 06/14/2023 01:09 PM   Modules accepted: Orders

## 2023-06-22 DIAGNOSIS — Z1231 Encounter for screening mammogram for malignant neoplasm of breast: Secondary | ICD-10-CM | POA: Diagnosis not present

## 2023-06-22 LAB — HM MAMMOGRAPHY

## 2023-07-03 ENCOUNTER — Encounter: Payer: Self-pay | Admitting: Obstetrics and Gynecology

## 2023-07-30 ENCOUNTER — Encounter: Payer: Self-pay | Admitting: Obstetrics and Gynecology

## 2023-11-23 ENCOUNTER — Ambulatory Visit

## 2023-11-23 DIAGNOSIS — Z1211 Encounter for screening for malignant neoplasm of colon: Secondary | ICD-10-CM | POA: Diagnosis not present

## 2024-07-10 NOTE — Progress Notes (Unsigned)
 "  ANNUAL GYNECOLOGICAL EXAM  SUBJECTIVE  HPI  Kari Gray is a 64 y.o.-year-old [id number] who presents for an annual gynecological exam today.  She denies pelvic pain, dyspareunia, abnormal vaginal bleeding or discharge, and UTI symptoms. ***  Medical/Surgical History Past Medical History:  Diagnosis Date   History of recurrent miscarriages    Osteopenia    TMJ (dislocation of temporomandibular joint)    Past Surgical History:  Procedure Laterality Date   COLONOSCOPY WITH ESOPHAGOGASTRODUODENOSCOPY (EGD)  11/2013   DILATION AND CURETTAGE OF UTERUS  1999/ 2000/ 2007   TMJ ARTHROPLASTY     TONSILLECTOMY     TUBAL LIGATION  2003   and diagnostic laparoscopy/ Dr Rolm   VAGINAL HYSTERECTOMY  2007   AUB and pelvic pain    Social History Lives with ***. ***Feels safe there Work: Exercise: Substances: ***EtOH, tobacco, vape, and recreational drugs  Obstetric History OB History     Gravida  3   Para      Term      Preterm      AB  3   Living         SAB  3   IAB      Ectopic      Multiple      Live Births               GYN/Menstrual History No LMP recorded. Patient has had a hysterectomy.  Last Pap:05/17/18 Contraception:hysterectomy  Prevention Dentist Eye exam Mammogram-06/22/23 Colonoscopy-11/27/23 Flu shot/vaccines  Current Medications Show/hide medication list[1]      ROS Constitutional: Denied constitutional symptoms, night sweats, recent illness, fatigue, fever, insomnia and weight loss.  Eyes: Denied eye symptoms, eye pain, photophobia, vision change and visual disturbance.  Ears/Nose/Throat/Neck: Denied ear, nose, throat or neck symptoms, hearing loss, nasal discharge, sinus congestion and sore throat.  Cardiovascular: Denied cardiovascular symptoms, arrhythmia, chest pain/pressure, edema, exercise intolerance, orthopnea and palpitations.  Respiratory: Denied pulmonary symptoms, asthma, pleuritic pain, productive sputum,  cough, dyspnea and wheezing.  Gastrointestinal: Denied gastro-esophageal reflux, melena, nausea and vomiting.  Genitourinary:*** Denied genitourinary symptoms including symptomatic vaginal discharge, pelvic relaxation issues, and urinary complaints.  Musculoskeletal: Denied musculoskeletal symptoms, stiffness, swelling, muscle weakness and myalgia.  Dermatologic: Denied dermatology symptoms, rash and scar.  Neurologic: Denied neurology symptoms, dizziness, headache, neck pain and syncope.  Psychiatric: Denied psychiatric symptoms, anxiety and depression.  Endocrine: Denied endocrine symptoms including hot flashes and night sweats.    OBJECTIVE  There were no vitals taken for this visit.   Physical examination General NAD, Conversant  HEENT Atraumatic; Op clear with mmm.  Normo-cephalic. Pupils reactive. Anicteric sclerae  Thyroid/Neck Smooth without nodularity or enlargement. Normal ROM.  Neck Supple.  Skin No rashes, lesions or ulceration. Normal palpated skin turgor. No nodularity.  Breasts: No masses or discharge.  Symmetric.  No axillary adenopathy.  Lungs: Clear to auscultation.No rales or wheezes. Normal Respiratory effort, no retractions.  Heart: NSR.  No murmurs or rubs appreciated. No peripheral edema  Abdomen: Soft.  Non-tender.  No masses.  No HSM. No hernia  Extremities: Moves all appropriately.  Normal ROM for age. No lymphadenopathy.  Neuro: Oriented to PPT.  Normal mood. Normal affect.     Pelvic:   Vulva: Normal appearance.  No lesions.  Vagina: No lesions or abnormalities noted.  Support: Normal pelvic support.  Urethra No masses tenderness or scarring.  Meatus Normal size without lesions or prolapse.  Cervix: Normal appearance.  No lesions.  Anus: Normal  exam.  No lesions.  Perineum: Normal exam.  No lesions.        Bimanual   Uterus: Normal size.  Non-tender.  Mobile.  AV.  Adnexae: No masses.  Non-tender to palpation.  Cul-de-sac: Negative for abnormality.     ASSESSMENT  1) Annual exam  PLAN 1) Physical exam as noted. Discussed healthy lifestyle choices and preventive care. 2) 3) Return in one year for annual exam or as needed for concerns.   Eleanor Canny, CNM     [1]  Outpatient Medications Prior to Visit  Medication Sig   aspirin EC 81 MG tablet Take by mouth.   cyanocobalamin (VITAMIN B12) 1000 MCG tablet Take 1,000 mcg by mouth daily.   Multiple Vitamin (MULTIVITAMIN) capsule Take by mouth.   POTASSIUM PO Take by mouth.   VITAMIN D PO Take by mouth.   No facility-administered medications prior to visit.   "

## 2024-07-11 ENCOUNTER — Ambulatory Visit: Admitting: Obstetrics

## 2024-07-11 ENCOUNTER — Encounter: Payer: Self-pay | Admitting: Obstetrics

## 2024-07-11 VITALS — BP 122/77 | HR 71 | Ht 65.0 in | Wt 147.0 lb

## 2024-07-11 DIAGNOSIS — Z1231 Encounter for screening mammogram for malignant neoplasm of breast: Secondary | ICD-10-CM

## 2024-07-11 DIAGNOSIS — Z01419 Encounter for gynecological examination (general) (routine) without abnormal findings: Secondary | ICD-10-CM
